# Patient Record
Sex: Female | Born: 1935 | Race: White | Hispanic: No | Marital: Married
Health system: Southern US, Community
[De-identification: ages and names within clinical notes are randomized; demographics above are authoritative.]

---

## 2009-08-26 ENCOUNTER — Inpatient Hospital Stay: Payer: Self-pay | Admitting: Internal Medicine

## 2009-12-03 ENCOUNTER — Inpatient Hospital Stay: Payer: Self-pay | Admitting: Podiatry

## 2010-09-08 ENCOUNTER — Ambulatory Visit: Payer: Self-pay | Admitting: Internal Medicine

## 2011-10-27 ENCOUNTER — Ambulatory Visit: Payer: Self-pay | Admitting: Internal Medicine

## 2012-10-10 ENCOUNTER — Inpatient Hospital Stay: Payer: Self-pay | Admitting: Internal Medicine

## 2012-10-10 LAB — CBC WITH DIFFERENTIAL/PLATELET
Basophil #: 0.1 10*3/uL (ref 0.0–0.1)
Basophil %: 1.2 %
HGB: 12 g/dL (ref 12.0–16.0)
Lymphocyte #: 0.9 10*3/uL — ABNORMAL LOW (ref 1.0–3.6)
Lymphocyte %: 9 %
MCHC: 32.5 g/dL (ref 32.0–36.0)
MCV: 95 fL (ref 80–100)
Monocyte %: 6.2 %
Platelet: 251 10*3/uL (ref 150–440)
RBC: 3.86 10*6/uL (ref 3.80–5.20)
RDW: 15.6 % — ABNORMAL HIGH (ref 11.5–14.5)

## 2012-10-10 LAB — URINALYSIS, COMPLETE
Blood: NEGATIVE
Glucose,UR: NEGATIVE mg/dL (ref 0–75)
Ketone: NEGATIVE
Nitrite: NEGATIVE
Protein: NEGATIVE
Specific Gravity: 1.013 (ref 1.003–1.030)
Squamous Epithelial: <1

## 2012-10-10 LAB — COMPREHENSIVE METABOLIC PANEL
Alkaline Phosphatase: 84 U/L (ref 50–136)
Anion Gap: 4 — ABNORMAL LOW (ref 7–16)
BUN: 45 mg/dL — ABNORMAL HIGH (ref 7–18)
Chloride: 107 mmol/L (ref 98–107)
Creatinine: 2.27 mg/dL — ABNORMAL HIGH (ref 0.60–1.30)
EGFR (Non-African Amer.): 19 — ABNORMAL LOW
Glucose: 144 mg/dL — ABNORMAL HIGH (ref 65–99)
Osmolality: 290 (ref 275–301)
SGOT(AST): 26 U/L (ref 15–37)
SGPT (ALT): 19 U/L (ref 12–78)
Sodium: 138 mmol/L (ref 136–145)

## 2012-10-10 LAB — TROPONIN I: Troponin-I: 0.06 ng/mL — ABNORMAL HIGH

## 2012-10-10 LAB — CK TOTAL AND CKMB (NOT AT ARMC)
CK, Total: 54 U/L (ref 21–215)
CK-MB: 2.7 ng/mL (ref 0.5–3.6)

## 2012-10-10 LAB — PRO B NATRIURETIC PEPTIDE: B-Type Natriuretic Peptide: 13150 pg/mL — ABNORMAL HIGH (ref 0–450)

## 2012-10-11 LAB — CK TOTAL AND CKMB (NOT AT ARMC)
CK, Total: 47 U/L (ref 21–215)
CK-MB: 2.1 ng/mL (ref 0.5–3.6)

## 2012-10-11 LAB — BASIC METABOLIC PANEL
Calcium, Total: 9.2 mg/dL (ref 8.5–10.1)
EGFR (African American): 23 — ABNORMAL LOW
Osmolality: 295 (ref 275–301)
Potassium: 4.1 mmol/L (ref 3.5–5.1)

## 2012-10-11 LAB — TROPONIN I: Troponin-I: 0.08 ng/mL — ABNORMAL HIGH

## 2012-10-12 LAB — CBC WITH DIFFERENTIAL/PLATELET
Basophil #: 0 10*3/uL (ref 0.0–0.1)
HCT: 35.6 % (ref 35.0–47.0)
Lymphocyte #: 0.5 10*3/uL — ABNORMAL LOW (ref 1.0–3.6)
MCH: 30.9 pg (ref 26.0–34.0)
MCHC: 32.7 g/dL (ref 32.0–36.0)
MCV: 95 fL (ref 80–100)
Monocyte #: 0.5 x10 3/mm (ref 0.2–0.9)
Neutrophil #: 7.7 10*3/uL — ABNORMAL HIGH (ref 1.4–6.5)
Neutrophil %: 86.1 %
Platelet: 223 10*3/uL (ref 150–440)
RBC: 3.76 10*6/uL — ABNORMAL LOW (ref 3.80–5.20)
RDW: 15.6 % — ABNORMAL HIGH (ref 11.5–14.5)

## 2012-10-12 LAB — BASIC METABOLIC PANEL
Anion Gap: 7 (ref 7–16)
BUN: 42 mg/dL — ABNORMAL HIGH (ref 7–18)
Chloride: 107 mmol/L (ref 98–107)
Co2: 29 mmol/L (ref 21–32)
EGFR (African American): 26 — ABNORMAL LOW
Glucose: 57 mg/dL — ABNORMAL LOW (ref 65–99)
Osmolality: 293 (ref 275–301)
Potassium: 4 mmol/L (ref 3.5–5.1)
Sodium: 143 mmol/L (ref 136–145)

## 2012-10-13 LAB — CBC WITH DIFFERENTIAL/PLATELET
Basophil %: 0.5 %
Eosinophil %: 3.2 %
HCT: 35.5 % (ref 35.0–47.0)
HGB: 11.5 g/dL — ABNORMAL LOW (ref 12.0–16.0)
Lymphocyte #: 1 10*3/uL (ref 1.0–3.6)
MCHC: 32.5 g/dL (ref 32.0–36.0)
MCV: 94 fL (ref 80–100)
Monocyte #: 0.7 x10 3/mm (ref 0.2–0.9)
Monocyte %: 8.7 %
Neutrophil %: 74.2 %
Platelet: 210 10*3/uL (ref 150–440)
RBC: 3.76 10*6/uL — ABNORMAL LOW (ref 3.80–5.20)
RDW: 15.4 % — ABNORMAL HIGH (ref 11.5–14.5)
WBC: 7.7 10*3/uL (ref 3.6–11.0)

## 2012-10-13 LAB — BASIC METABOLIC PANEL
Anion Gap: 5 — ABNORMAL LOW (ref 7–16)
Calcium, Total: 9.1 mg/dL (ref 8.5–10.1)
Chloride: 106 mmol/L (ref 98–107)
Co2: 32 mmol/L (ref 21–32)
Creatinine: 2.02 mg/dL — ABNORMAL HIGH (ref 0.60–1.30)
EGFR (Non-African Amer.): 22 — ABNORMAL LOW
Glucose: 82 mg/dL (ref 65–99)
Potassium: 3.9 mmol/L (ref 3.5–5.1)
Sodium: 143 mmol/L (ref 136–145)

## 2012-10-14 LAB — BASIC METABOLIC PANEL
Anion Gap: 8 (ref 7–16)
BUN: 40 mg/dL — ABNORMAL HIGH (ref 7–18)
Calcium, Total: 8.5 mg/dL (ref 8.5–10.1)
Co2: 29 mmol/L (ref 21–32)
Creatinine: 1.9 mg/dL — ABNORMAL HIGH (ref 0.60–1.30)
EGFR (Non-African Amer.): 23 — ABNORMAL LOW
Glucose: 180 mg/dL — ABNORMAL HIGH (ref 65–99)
Osmolality: 290 (ref 275–301)

## 2012-10-14 LAB — CBC WITH DIFFERENTIAL/PLATELET
Basophil #: 0 10*3/uL (ref 0.0–0.1)
HCT: 36.3 % (ref 35.0–47.0)
HGB: 11.9 g/dL — ABNORMAL LOW (ref 12.0–16.0)
Lymphocyte %: 8.9 %
MCH: 31 pg (ref 26.0–34.0)
MCHC: 32.7 g/dL (ref 32.0–36.0)
Monocyte %: 8.1 %
Neutrophil #: 6.9 10*3/uL — ABNORMAL HIGH (ref 1.4–6.5)
Platelet: 190 10*3/uL (ref 150–440)
RBC: 3.82 10*6/uL (ref 3.80–5.20)
WBC: 8.6 10*3/uL (ref 3.6–11.0)

## 2012-10-17 DIAGNOSIS — I10 Essential (primary) hypertension: Secondary | ICD-10-CM

## 2012-10-17 DIAGNOSIS — E039 Hypothyroidism, unspecified: Secondary | ICD-10-CM

## 2012-10-17 DIAGNOSIS — E1129 Type 2 diabetes mellitus with other diabetic kidney complication: Secondary | ICD-10-CM

## 2012-10-17 DIAGNOSIS — F329 Major depressive disorder, single episode, unspecified: Secondary | ICD-10-CM

## 2012-10-17 DIAGNOSIS — Z9981 Dependence on supplemental oxygen: Secondary | ICD-10-CM

## 2012-10-17 DIAGNOSIS — E785 Hyperlipidemia, unspecified: Secondary | ICD-10-CM

## 2012-10-17 DIAGNOSIS — K219 Gastro-esophageal reflux disease without esophagitis: Secondary | ICD-10-CM

## 2012-10-21 DIAGNOSIS — I509 Heart failure, unspecified: Secondary | ICD-10-CM

## 2012-10-25 DIAGNOSIS — L259 Unspecified contact dermatitis, unspecified cause: Secondary | ICD-10-CM

## 2012-11-11 ENCOUNTER — Emergency Department: Payer: Self-pay | Admitting: Emergency Medicine

## 2012-11-11 LAB — BASIC METABOLIC PANEL
BUN: 50 mg/dL — ABNORMAL HIGH (ref 7–18)
Calcium, Total: 9.5 mg/dL (ref 8.5–10.1)
Co2: 24 mmol/L (ref 21–32)
Creatinine: 1.58 mg/dL — ABNORMAL HIGH (ref 0.60–1.30)
EGFR (Non-African Amer.): 29 — ABNORMAL LOW
Osmolality: 295 (ref 275–301)
Potassium: 4.2 mmol/L (ref 3.5–5.1)
Sodium: 140 mmol/L (ref 136–145)

## 2012-11-11 LAB — CBC
MCH: 31 pg (ref 26.0–34.0)
Platelet: 152 10*3/uL (ref 150–440)
RBC: 3.93 10*6/uL (ref 3.80–5.20)
RDW: 16.4 % — ABNORMAL HIGH (ref 11.5–14.5)

## 2012-12-05 ENCOUNTER — Other Ambulatory Visit: Payer: Self-pay | Admitting: Family Medicine

## 2012-12-18 ENCOUNTER — Other Ambulatory Visit: Payer: Self-pay | Admitting: Family Medicine

## 2013-01-09 ENCOUNTER — Ambulatory Visit: Payer: Self-pay | Admitting: Family Medicine

## 2013-01-27 ENCOUNTER — Inpatient Hospital Stay: Payer: Self-pay | Admitting: Internal Medicine

## 2013-01-27 LAB — COMPREHENSIVE METABOLIC PANEL
Anion Gap: 5 — ABNORMAL LOW (ref 7–16)
BUN: 35 mg/dL — ABNORMAL HIGH (ref 7–18)
Bilirubin,Total: 0.8 mg/dL (ref 0.2–1.0)
Calcium, Total: 9.4 mg/dL (ref 8.5–10.1)
Co2: 28 mmol/L (ref 21–32)
Creatinine: 1.51 mg/dL — ABNORMAL HIGH (ref 0.60–1.30)
EGFR (Non-African Amer.): 31 — ABNORMAL LOW
Glucose: 144 mg/dL — ABNORMAL HIGH (ref 65–99)
Osmolality: 277 (ref 275–301)

## 2013-01-27 LAB — CBC
HGB: 12.3 g/dL (ref 12.0–16.0)
MCH: 31.6 pg (ref 26.0–34.0)
MCV: 93 fL (ref 80–100)
Platelet: 201 10*3/uL (ref 150–440)
RBC: 3.9 10*6/uL (ref 3.80–5.20)
WBC: 8.6 10*3/uL (ref 3.6–11.0)

## 2013-01-27 LAB — APTT: Activated PTT: 30.9 secs (ref 23.6–35.9)

## 2013-01-27 LAB — PRO B NATRIURETIC PEPTIDE: B-Type Natriuretic Peptide: 11605 pg/mL — ABNORMAL HIGH (ref 0–450)

## 2013-01-27 LAB — CK TOTAL AND CKMB (NOT AT ARMC)
CK, Total: 35 U/L (ref 21–215)
CK-MB: 1.6 ng/mL (ref 0.5–3.6)

## 2013-01-27 LAB — TROPONIN I: Troponin-I: 0.04 ng/mL

## 2013-01-28 LAB — BASIC METABOLIC PANEL
Anion Gap: 6 — ABNORMAL LOW (ref 7–16)
Co2: 28 mmol/L (ref 21–32)
Creatinine: 1.48 mg/dL — ABNORMAL HIGH (ref 0.60–1.30)
Glucose: 101 mg/dL — ABNORMAL HIGH (ref 65–99)
Osmolality: 281 (ref 275–301)
Potassium: 4.6 mmol/L (ref 3.5–5.1)
Sodium: 136 mmol/L (ref 136–145)

## 2013-01-28 LAB — TSH: Thyroid Stimulating Horm: 13.9 u[IU]/mL — ABNORMAL HIGH

## 2013-01-28 LAB — HEMOGLOBIN A1C: Hemoglobin A1C: 7.4 % — ABNORMAL HIGH (ref 4.2–6.3)

## 2013-01-29 LAB — CBC WITH DIFFERENTIAL/PLATELET
Basophil #: 0.1 10*3/uL (ref 0.0–0.1)
Eosinophil %: 2.1 %
HCT: 31.6 % — ABNORMAL LOW (ref 35.0–47.0)
Lymphocyte %: 14.4 %
MCHC: 33.7 g/dL (ref 32.0–36.0)
MCV: 92 fL (ref 80–100)
Monocyte #: 0.8 x10 3/mm (ref 0.2–0.9)
Monocyte %: 12.6 %
Neutrophil #: 4.4 10*3/uL (ref 1.4–6.5)
Neutrophil %: 70 %
Platelet: 175 10*3/uL (ref 150–440)
RDW: 15.6 % — ABNORMAL HIGH (ref 11.5–14.5)

## 2013-01-29 LAB — BASIC METABOLIC PANEL
Anion Gap: 7 (ref 7–16)
BUN: 42 mg/dL — ABNORMAL HIGH (ref 7–18)
Calcium, Total: 9 mg/dL (ref 8.5–10.1)
Chloride: 100 mmol/L (ref 98–107)
Co2: 28 mmol/L (ref 21–32)
EGFR (African American): 31 — ABNORMAL LOW
EGFR (Non-African Amer.): 27 — ABNORMAL LOW
Glucose: 85 mg/dL (ref 65–99)
Potassium: 3.9 mmol/L (ref 3.5–5.1)
Sodium: 135 mmol/L — ABNORMAL LOW (ref 136–145)

## 2013-01-30 LAB — BASIC METABOLIC PANEL
Anion Gap: 6 — ABNORMAL LOW (ref 7–16)
BUN: 44 mg/dL — ABNORMAL HIGH (ref 7–18)
Calcium, Total: 8.7 mg/dL (ref 8.5–10.1)
Chloride: 98 mmol/L (ref 98–107)
Co2: 28 mmol/L (ref 21–32)
Creatinine: 1.77 mg/dL — ABNORMAL HIGH (ref 0.60–1.30)
EGFR (African American): 30 — ABNORMAL LOW
Glucose: 87 mg/dL (ref 65–99)
Osmolality: 275 (ref 275–301)
Sodium: 132 mmol/L — ABNORMAL LOW (ref 136–145)

## 2013-01-31 LAB — BASIC METABOLIC PANEL
Anion Gap: 7 (ref 7–16)
BUN: 46 mg/dL — ABNORMAL HIGH (ref 7–18)
Calcium, Total: 8.5 mg/dL (ref 8.5–10.1)
Chloride: 96 mmol/L — ABNORMAL LOW (ref 98–107)
Co2: 28 mmol/L (ref 21–32)
Creatinine: 2.05 mg/dL — ABNORMAL HIGH (ref 0.60–1.30)
EGFR (African American): 25 — ABNORMAL LOW
EGFR (Non-African Amer.): 21 — ABNORMAL LOW
Glucose: 61 mg/dL — ABNORMAL LOW (ref 65–99)
Osmolality: 272 (ref 275–301)
Potassium: 4.3 mmol/L (ref 3.5–5.1)
Sodium: 131 mmol/L — ABNORMAL LOW (ref 136–145)

## 2013-01-31 LAB — CBC WITH DIFFERENTIAL/PLATELET
Basophil #: 0 10*3/uL (ref 0.0–0.1)
Basophil %: 0.7 %
Eosinophil #: 0.2 10*3/uL (ref 0.0–0.7)
Eosinophil %: 3.8 %
HCT: 31.7 % — ABNORMAL LOW (ref 35.0–47.0)
Lymphocyte %: 15.8 %
MCHC: 34.5 g/dL (ref 32.0–36.0)
Neutrophil %: 67.4 %
RBC: 3.43 10*6/uL — ABNORMAL LOW (ref 3.80–5.20)
WBC: 5.3 10*3/uL (ref 3.6–11.0)

## 2013-02-01 LAB — CBC WITH DIFFERENTIAL/PLATELET
Basophil #: 0 10*3/uL (ref 0.0–0.1)
Basophil %: 0.7 %
Eosinophil #: 0.2 10*3/uL (ref 0.0–0.7)
Eosinophil %: 3 %
HGB: 11.1 g/dL — ABNORMAL LOW (ref 12.0–16.0)
Lymphocyte #: 0.8 10*3/uL — ABNORMAL LOW (ref 1.0–3.6)
Lymphocyte %: 11.5 %
MCH: 30.8 pg (ref 26.0–34.0)
MCV: 93 fL (ref 80–100)
Monocyte %: 9.3 %
Neutrophil #: 5 10*3/uL (ref 1.4–6.5)
Neutrophil %: 75.5 %
Platelet: 178 10*3/uL (ref 150–440)
RBC: 3.58 10*6/uL — ABNORMAL LOW (ref 3.80–5.20)

## 2013-02-01 LAB — BASIC METABOLIC PANEL
BUN: 52 mg/dL — ABNORMAL HIGH (ref 7–18)
Chloride: 89 mmol/L — ABNORMAL LOW (ref 98–107)
Creatinine: 2.25 mg/dL — ABNORMAL HIGH (ref 0.60–1.30)
EGFR (African American): 22 — ABNORMAL LOW
EGFR (Non-African Amer.): 19 — ABNORMAL LOW
Glucose: 66 mg/dL (ref 65–99)
Osmolality: 262 (ref 275–301)
Potassium: 4.8 mmol/L (ref 3.5–5.1)
Sodium: 124 mmol/L — ABNORMAL LOW (ref 136–145)

## 2013-02-02 DIAGNOSIS — I10 Essential (primary) hypertension: Secondary | ICD-10-CM

## 2013-02-02 DIAGNOSIS — I5023 Acute on chronic systolic (congestive) heart failure: Secondary | ICD-10-CM

## 2013-02-02 DIAGNOSIS — E1149 Type 2 diabetes mellitus with other diabetic neurological complication: Secondary | ICD-10-CM

## 2013-02-02 DIAGNOSIS — N184 Chronic kidney disease, stage 4 (severe): Secondary | ICD-10-CM

## 2013-02-03 ENCOUNTER — Inpatient Hospital Stay: Payer: Self-pay | Admitting: Internal Medicine

## 2013-02-03 DIAGNOSIS — F411 Generalized anxiety disorder: Secondary | ICD-10-CM

## 2013-02-03 LAB — COMPREHENSIVE METABOLIC PANEL
Anion Gap: 7 (ref 7–16)
Bilirubin,Total: 0.6 mg/dL (ref 0.2–1.0)
Calcium, Total: 8.9 mg/dL (ref 8.5–10.1)
Chloride: 89 mmol/L — ABNORMAL LOW (ref 98–107)
Creatinine: 1.97 mg/dL — ABNORMAL HIGH (ref 0.60–1.30)
EGFR (African American): 26 — ABNORMAL LOW
EGFR (Non-African Amer.): 22 — ABNORMAL LOW
Glucose: 169 mg/dL — ABNORMAL HIGH (ref 65–99)
Osmolality: 269 (ref 275–301)
SGPT (ALT): 19 U/L (ref 12–78)
Sodium: 124 mmol/L — ABNORMAL LOW (ref 136–145)

## 2013-02-03 LAB — TROPONIN I: Troponin-I: 0.03 ng/mL

## 2013-02-03 LAB — URINALYSIS, COMPLETE
Bilirubin,UR: NEGATIVE
Glucose,UR: NEGATIVE mg/dL (ref 0–75)
Ketone: NEGATIVE
Nitrite: POSITIVE
Protein: NEGATIVE
RBC,UR: <1 /HPF (ref 0–5)
Specific Gravity: 1.011 (ref 1.003–1.030)
Squamous Epithelial: NONE SEEN

## 2013-02-03 LAB — CBC: MCH: 31.6 pg (ref 26.0–34.0)

## 2013-02-03 LAB — PRO B NATRIURETIC PEPTIDE: B-Type Natriuretic Peptide: 23513 pg/mL — ABNORMAL HIGH (ref 0–450)

## 2013-02-04 LAB — BASIC METABOLIC PANEL
Anion Gap: 6 — ABNORMAL LOW (ref 7–16)
Calcium, Total: 9 mg/dL (ref 8.5–10.1)
Chloride: 90 mmol/L — ABNORMAL LOW (ref 98–107)
Creatinine: 1.91 mg/dL — ABNORMAL HIGH (ref 0.60–1.30)
EGFR (African American): 27 — ABNORMAL LOW
Osmolality: 275 (ref 275–301)
Potassium: 4.4 mmol/L (ref 3.5–5.1)

## 2013-02-05 LAB — BASIC METABOLIC PANEL
Anion Gap: 7 (ref 7–16)
BUN: 61 mg/dL — ABNORMAL HIGH (ref 7–18)
Calcium, Total: 8.9 mg/dL (ref 8.5–10.1)
Chloride: 89 mmol/L — ABNORMAL LOW (ref 98–107)
Creatinine: 1.85 mg/dL — ABNORMAL HIGH (ref 0.60–1.30)
EGFR (Non-African Amer.): 24 — ABNORMAL LOW
Glucose: 118 mg/dL — ABNORMAL HIGH (ref 65–99)
Osmolality: 268 (ref 275–301)
Potassium: 4.3 mmol/L (ref 3.5–5.1)
Sodium: 124 mmol/L — ABNORMAL LOW (ref 136–145)

## 2013-02-05 LAB — URINE CULTURE

## 2013-02-06 LAB — BASIC METABOLIC PANEL
Co2: 31 mmol/L (ref 21–32)
EGFR (African American): 33 — ABNORMAL LOW
Glucose: 173 mg/dL — ABNORMAL HIGH (ref 65–99)
Potassium: 4.2 mmol/L (ref 3.5–5.1)
Sodium: 126 mmol/L — ABNORMAL LOW (ref 136–145)

## 2013-02-06 LAB — CBC WITH DIFFERENTIAL/PLATELET
Basophil #: 0.1 10*3/uL (ref 0.0–0.1)
Basophil %: 0.8 %
Lymphocyte #: 0.5 10*3/uL — ABNORMAL LOW (ref 1.0–3.6)
Lymphocyte %: 6.1 %
MCH: 31.5 pg (ref 26.0–34.0)
MCV: 92 fL (ref 80–100)
Monocyte #: 0.7 x10 3/mm (ref 0.2–0.9)
Neutrophil #: 6.3 10*3/uL (ref 1.4–6.5)
Platelet: 183 10*3/uL (ref 150–440)
RDW: 14.9 % — ABNORMAL HIGH (ref 11.5–14.5)
WBC: 7.7 10*3/uL (ref 3.6–11.0)

## 2013-02-07 LAB — CBC WITH DIFFERENTIAL/PLATELET
Basophil #: 0 10*3/uL (ref 0.0–0.1)
Basophil %: 0.5 %
Eosinophil #: 0.1 10*3/uL (ref 0.0–0.7)
HCT: 33.3 % — ABNORMAL LOW (ref 35.0–47.0)
HGB: 11.5 g/dL — ABNORMAL LOW (ref 12.0–16.0)
Lymphocyte #: 0.6 10*3/uL — ABNORMAL LOW (ref 1.0–3.6)
Lymphocyte %: 9.3 %
MCH: 31.6 pg (ref 26.0–34.0)
MCV: 92 fL (ref 80–100)
Monocyte %: 8.1 %
Neutrophil #: 5.1 10*3/uL (ref 1.4–6.5)
RBC: 3.64 10*6/uL — ABNORMAL LOW (ref 3.80–5.20)
RDW: 15.3 % — ABNORMAL HIGH (ref 11.5–14.5)

## 2013-02-07 LAB — BASIC METABOLIC PANEL
Anion Gap: 8 (ref 7–16)
BUN: 50 mg/dL — ABNORMAL HIGH (ref 7–18)
Calcium, Total: 9.6 mg/dL (ref 8.5–10.1)
Chloride: 92 mmol/L — ABNORMAL LOW (ref 98–107)
EGFR (African American): 39 — ABNORMAL LOW
Glucose: 175 mg/dL — ABNORMAL HIGH (ref 65–99)
Osmolality: 275 (ref 275–301)
Potassium: 4 mmol/L (ref 3.5–5.1)
Sodium: 128 mmol/L — ABNORMAL LOW (ref 136–145)

## 2013-02-08 LAB — CBC WITH DIFFERENTIAL/PLATELET
Basophil %: 0.7 %
Eosinophil %: 3.2 %
HCT: 33.7 % — ABNORMAL LOW (ref 35.0–47.0)
Lymphocyte #: 0.8 10*3/uL — ABNORMAL LOW (ref 1.0–3.6)
MCH: 31.3 pg (ref 26.0–34.0)
MCHC: 33.9 g/dL (ref 32.0–36.0)
MCV: 92 fL (ref 80–100)
Monocyte #: 0.6 x10 3/mm (ref 0.2–0.9)
Monocyte %: 9.3 %
RBC: 3.66 10*6/uL — ABNORMAL LOW (ref 3.80–5.20)
WBC: 6.6 10*3/uL (ref 3.6–11.0)

## 2013-02-08 LAB — BASIC METABOLIC PANEL
Chloride: 96 mmol/L — ABNORMAL LOW (ref 98–107)
Co2: 30 mmol/L (ref 21–32)
Creatinine: 1.38 mg/dL — ABNORMAL HIGH (ref 0.60–1.30)
EGFR (Non-African Amer.): 35 — ABNORMAL LOW
Osmolality: 280 (ref 275–301)
Potassium: 3.9 mmol/L (ref 3.5–5.1)
Sodium: 132 mmol/L — ABNORMAL LOW (ref 136–145)

## 2013-02-09 LAB — BASIC METABOLIC PANEL
Anion Gap: 7 (ref 7–16)
Calcium, Total: 9 mg/dL (ref 8.5–10.1)
Chloride: 96 mmol/L — ABNORMAL LOW (ref 98–107)
Co2: 30 mmol/L (ref 21–32)
EGFR (African American): 39 — ABNORMAL LOW
EGFR (Non-African Amer.): 34 — ABNORMAL LOW
Glucose: 172 mg/dL — ABNORMAL HIGH (ref 65–99)
Osmolality: 280 (ref 275–301)
Potassium: 3.8 mmol/L (ref 3.5–5.1)
Sodium: 133 mmol/L — ABNORMAL LOW (ref 136–145)

## 2013-02-09 LAB — CBC WITH DIFFERENTIAL/PLATELET
Eosinophil #: 0.2 10*3/uL (ref 0.0–0.7)
HCT: 33.2 % — ABNORMAL LOW (ref 35.0–47.0)
Lymphocyte #: 0.8 10*3/uL — ABNORMAL LOW (ref 1.0–3.6)
MCH: 31.8 pg (ref 26.0–34.0)
Monocyte #: 0.7 x10 3/mm (ref 0.2–0.9)
Neutrophil #: 5.5 10*3/uL (ref 1.4–6.5)
Neutrophil %: 75.6 %
Platelet: 200 10*3/uL (ref 150–440)
RBC: 3.63 10*6/uL — ABNORMAL LOW (ref 3.80–5.20)
WBC: 7.3 10*3/uL (ref 3.6–11.0)

## 2013-02-10 LAB — BASIC METABOLIC PANEL
Calcium, Total: 9.1 mg/dL (ref 8.5–10.1)
Chloride: 95 mmol/L — ABNORMAL LOW (ref 98–107)
EGFR (Non-African Amer.): 31 — ABNORMAL LOW
Glucose: 174 mg/dL — ABNORMAL HIGH (ref 65–99)
Osmolality: 277 (ref 275–301)

## 2013-02-10 LAB — MAGNESIUM: Magnesium: 1.5 mg/dL — ABNORMAL LOW

## 2013-02-13 DIAGNOSIS — I10 Essential (primary) hypertension: Secondary | ICD-10-CM

## 2013-02-13 DIAGNOSIS — I5022 Chronic systolic (congestive) heart failure: Secondary | ICD-10-CM

## 2013-02-13 DIAGNOSIS — E1149 Type 2 diabetes mellitus with other diabetic neurological complication: Secondary | ICD-10-CM

## 2013-03-06 ENCOUNTER — Other Ambulatory Visit: Payer: Self-pay | Admitting: Family Medicine

## 2013-04-08 ENCOUNTER — Other Ambulatory Visit: Payer: Self-pay | Admitting: Internal Medicine

## 2013-04-11 ENCOUNTER — Other Ambulatory Visit: Payer: Self-pay | Admitting: Internal Medicine

## 2013-05-25 ENCOUNTER — Other Ambulatory Visit: Payer: Self-pay | Admitting: Family Medicine

## 2013-05-25 NOTE — Telephone Encounter (Signed)
Electronic refill request, it looks like pt cancelled his new pt appt with you back in June but not sure if you see pt at nursing home, please advise

## 2013-05-25 NOTE — Telephone Encounter (Signed)
Declined.  This is not my pt.  I saw her only at [facility].  She has not established here- needs to get rx from PCP.

## 2013-05-26 NOTE — Telephone Encounter (Signed)
done

## 2013-07-23 LAB — CBC WITH DIFFERENTIAL/PLATELET
Basophil #: 0 10*3/uL (ref 0.0–0.1)
Basophil %: 0.6 %
Eosinophil #: 0.4 10*3/uL (ref 0.0–0.7)
HGB: 11.2 g/dL — ABNORMAL LOW (ref 12.0–16.0)
Lymphocyte #: 1.1 10*3/uL (ref 1.0–3.6)
MCHC: 32.4 g/dL (ref 32.0–36.0)
MCV: 97 fL (ref 80–100)
Monocyte %: 7.9 %
Neutrophil #: 5.3 10*3/uL (ref 1.4–6.5)
Neutrophil %: 71.9 %
RBC: 3.6 10*6/uL — ABNORMAL LOW (ref 3.80–5.20)

## 2013-07-23 LAB — COMPREHENSIVE METABOLIC PANEL
Alkaline Phosphatase: 151 U/L — ABNORMAL HIGH
BUN: 59 mg/dL — ABNORMAL HIGH (ref 7–18)
Bilirubin,Total: 0.3 mg/dL (ref 0.2–1.0)
Calcium, Total: 9.5 mg/dL (ref 8.5–10.1)
EGFR (African American): 24 — ABNORMAL LOW
EGFR (Non-African Amer.): 21 — ABNORMAL LOW
Glucose: 267 mg/dL — ABNORMAL HIGH (ref 65–99)
Osmolality: 296 (ref 275–301)
Potassium: 4.3 mmol/L (ref 3.5–5.1)
SGOT(AST): 21 U/L (ref 15–37)
Sodium: 135 mmol/L — ABNORMAL LOW (ref 136–145)
Total Protein: 7.9 g/dL (ref 6.4–8.2)

## 2013-07-23 LAB — TROPONIN I: Troponin-I: 0.06 ng/mL — ABNORMAL HIGH

## 2013-07-24 ENCOUNTER — Inpatient Hospital Stay: Payer: Self-pay | Admitting: Internal Medicine

## 2013-07-24 LAB — CK-MB: CK-MB: 12.8 ng/mL — ABNORMAL HIGH (ref 0.5–3.6)

## 2013-07-24 LAB — APTT
Activated PTT: 30.9 secs (ref 23.6–35.9)
Activated PTT: 124.3 secs — ABNORMAL HIGH (ref 23.6–35.9)
Activated PTT: 122.7 secs — ABNORMAL HIGH (ref 23.6–35.9)

## 2013-07-24 LAB — URINALYSIS, COMPLETE
Glucose,UR: NEGATIVE mg/dL (ref 0–75)
Nitrite: NEGATIVE
Ph: 5 (ref 4.5–8.0)
Ph: 5 (ref 4.5–8.0)
Squamous Epithelial: 5

## 2013-07-24 LAB — TROPONIN I
Troponin-I: 12 ng/mL — ABNORMAL HIGH
Troponin-I: 26 ng/mL — ABNORMAL HIGH

## 2013-07-25 LAB — CBC WITH DIFFERENTIAL/PLATELET
Eosinophil %: 0 %
HCT: 30.5 % — ABNORMAL LOW (ref 35.0–47.0)
HGB: 10.1 g/dL — ABNORMAL LOW (ref 12.0–16.0)
Lymphocyte #: 0.8 10*3/uL — ABNORMAL LOW (ref 1.0–3.6)
Lymphocyte %: 4.3 %
MCHC: 33.1 g/dL (ref 32.0–36.0)
MCV: 97 fL (ref 80–100)
Monocyte %: 6.6 %
Neutrophil #: 17.4 10*3/uL — ABNORMAL HIGH (ref 1.4–6.5)
Neutrophil %: 88.8 %
Platelet: 210 10*3/uL (ref 150–440)
RBC: 3.15 10*6/uL — ABNORMAL LOW (ref 3.80–5.20)
WBC: 19.6 10*3/uL — ABNORMAL HIGH (ref 3.6–11.0)

## 2013-07-25 LAB — APTT
Activated PTT: 127.3 secs — ABNORMAL HIGH (ref 23.6–35.9)
Activated PTT: 81 secs — ABNORMAL HIGH (ref 23.6–35.9)

## 2013-07-25 LAB — BASIC METABOLIC PANEL
Chloride: 106 mmol/L (ref 98–107)
Creatinine: 2.34 mg/dL — ABNORMAL HIGH (ref 0.60–1.30)
EGFR (African American): 21 — ABNORMAL LOW
EGFR (Non-African Amer.): 18 — ABNORMAL LOW
Glucose: 250 mg/dL — ABNORMAL HIGH (ref 65–99)
Sodium: 135 mmol/L — ABNORMAL LOW (ref 136–145)

## 2013-07-25 LAB — MAGNESIUM: Magnesium: 1.7 mg/dL — ABNORMAL LOW

## 2013-07-25 LAB — LIPID PANEL: HDL Cholesterol: 41 mg/dL (ref 40–60)

## 2013-07-26 ENCOUNTER — Ambulatory Visit: Payer: Self-pay | Admitting: Internal Medicine

## 2013-07-26 LAB — CBC WITH DIFFERENTIAL/PLATELET
Basophil #: 0.2 10*3/uL — ABNORMAL HIGH (ref 0.0–0.1)
Basophil %: 0.6 %
Eosinophil %: 0 %
Lymphocyte #: 0.5 10*3/uL — ABNORMAL LOW (ref 1.0–3.6)
Lymphocyte %: 1.4 %
MCH: 31.3 pg (ref 26.0–34.0)
MCHC: 32.2 g/dL (ref 32.0–36.0)
Neutrophil #: 31.7 10*3/uL — ABNORMAL HIGH (ref 1.4–6.5)
Neutrophil %: 95.3 %
Platelet: 197 10*3/uL (ref 150–440)
RBC: 3.09 10*6/uL — ABNORMAL LOW (ref 3.80–5.20)
RDW: 17.1 % — ABNORMAL HIGH (ref 11.5–14.5)
WBC: 33.3 10*3/uL — ABNORMAL HIGH (ref 3.6–11.0)

## 2013-07-26 LAB — APTT: Activated PTT: 40.8 secs — ABNORMAL HIGH (ref 23.6–35.9)

## 2013-07-26 LAB — BASIC METABOLIC PANEL
Anion Gap: 11 (ref 7–16)
BUN: 74 mg/dL — ABNORMAL HIGH (ref 7–18)
Calcium, Total: 8.4 mg/dL — ABNORMAL LOW (ref 8.5–10.1)
Chloride: 107 mmol/L (ref 98–107)
Co2: 18 mmol/L — ABNORMAL LOW (ref 21–32)
Creatinine: 2.85 mg/dL — ABNORMAL HIGH (ref 0.60–1.30)
EGFR (African American): 17 — ABNORMAL LOW
EGFR (Non-African Amer.): 14 — ABNORMAL LOW
Osmolality: 299 (ref 275–301)
Sodium: 136 mmol/L (ref 136–145)

## 2013-07-26 LAB — URINE CULTURE

## 2013-07-27 DIAGNOSIS — I241 Dressler's syndrome: Secondary | ICD-10-CM

## 2013-07-27 DIAGNOSIS — E1149 Type 2 diabetes mellitus with other diabetic neurological complication: Secondary | ICD-10-CM

## 2013-07-27 DIAGNOSIS — N184 Chronic kidney disease, stage 4 (severe): Secondary | ICD-10-CM

## 2013-07-27 DIAGNOSIS — I5032 Chronic diastolic (congestive) heart failure: Secondary | ICD-10-CM

## 2013-07-31 ENCOUNTER — Telehealth: Payer: Self-pay | Admitting: Family Medicine

## 2013-07-31 NOTE — Telephone Encounter (Signed)
Confidential Office Message [address] [place], [place] [address] p. [phone] f. [phone] To: Gar Gibbon (After Hours Triage) Fax: [phone] From: [facility] Date/ Time: August 22, 2013 6:30 AM Taken By: Coralie Keens, CSR Caller: Tammy Facility: [facility] Patient: Diana Wyatt, Diana Wyatt DOB: September 05, 1923 Phone: [phone] Reason for Call: Tammy RN/[facility] reporting death. Pt expired 08-22-13 at 0615. Regarding Appointment: Appt Date: Appt Time: Unknown Provider: Reason: Details: Outcome: Confidential

## 2013-07-31 NOTE — Telephone Encounter (Signed)
noted 

## 2013-08-10 ENCOUNTER — Ambulatory Visit: Payer: Self-pay | Admitting: Internal Medicine

## 2013-08-10 DEATH — deceased

## 2014-11-30 NOTE — H&P (Signed)
PATIENT NAME:  Diana Wyatt, Diana Wyatt MR#:  [id number] DATE OF BIRTH:  September 18, 1923  DATE OF ADMISSION:  10/10/2012  PRIMARY CARE PHYSICIAN:  Dr. Marcello Fennel at [facility].   CHIEF COMPLAINT:  Shortness of breath with dry cough.   HISTORY OF PRESENTING ILLNESS:  An 81 year old female patient with history of diabetes, hypertension, CKD stage IV with baseline creatinine around 2 to 2.5 who presents to the Emergency Room with 2 weeks of shortness of breath which is progressively worsening. The patient was found to be having saturations in the 80s and presently is on 3 liters of oxygen. A chest x-ray shows bilateral pulmonary edema with pleural effusions. She has received a dose of IV Lasix 60 mg. Afebrile. Normal white count.   She does not complain of any PND, orthopnea or edema. She saw Dr. Gwen Pounds 1 week prior in his clinic. The patient is scheduled for an echocardiogram later this week as outpatient. She does not complain of any chest pain, nausea, vomiting, dysuria, hematuria, rash or fever. Has a dry cough. No history of congestive heart failure. Does not use any home oxygen and does not smoke.   PAST MEDICAL HISTORY:  Hypertension, diabetes mellitus, CKD stage IV with creatinine baseline between 2 to 2.5.   ALLERGIES:  No known drug allergies.   SOCIAL HISTORY:  The patient lives at home with her husband. Ambulates on her own. No smoking. No alcohol. No illicit drugs.   CODE STATUS:  DNR/DNI as discussed with the patient with husband at bedside.   FAMILY HISTORY:  Reviewed, unknown.   REVIEW OF SYSTEMS:  CONSTITUTIONAL: No fever, complains of fatigue and weakness, no weight loss, weight gain.  EYES: No blurred vision, pain or redness.  ENT: No tinnitus, ear pain, hearing loss.  RESPIRATORY: Has a dry cough, shortness of breath with hypoxia.  CARDIOVASCULAR: No chest pain, orthopnea, edema or arrhythmias.  GASTROINTESTINAL: No nausea, vomiting, diarrhea, abdominal pain.  GENITOURINARY: No  dysuria, hematuria, frequency.  ENDOCRINE: No polyuria or nocturia. Has hypothyroidism.  HEMATOLOGIC/LYMPHATIC: No anemia, easy bruising, bleeding.  INTEGUMENTARY: No acne, rash, lesions.  MUSCULOSKELETAL: No back pain or arthritis.  NEUROLOGIC: No focal numbness, weakness, dysarthria.  PSYCHIATRIC: No anxiety or depression.   HOME MEDICATIONS:   1.  Allopurinol 100 mg once a day. 2.  Aspirin 81 mg once a day.  3.  Bystolic 5 mg oral once a day.  4.  Calcium carbonate 1 tablet oral once a day.  5.  Coenzyme Q 1 capsule oral once a day.  6.  Lantus 30 units in the morning and 20 units at night.  7.  Lasix 40 mg oral 2 times a day.  8.  Lipitor 20 mg oral once a day.  9.  Multivitamin 1 tablet oral once a day.  10.  Neurontin 300 mg oral once a day.  11.  Norvasc 10 mg oral once a day.  12.  Omeprazole 20 mg once a day.  13.  Sertraline 75 mg once a day.  14.  Synthroid 88 mcg oral once a day.   PHYSICAL EXAMINATION: VITAL SIGNS: Temperature 97.8, pulse 65, respirations 22, blood pressure 171/75, saturating 80% on room air and 93% on 3 liters of oxygen.  GENERAL: Elderly Caucasian female patient lying in bed in respiratory distress with conversational dyspnea and coughing.  PSYCHIATRIC: Alert and oriented x 3. Mood and affect appropriate. Judgment intact.  HEENT: Atraumatic, normocephalic. Oral mucosa moist and pink. External ears and nose normal. No pallor.  Pupils bilaterally equal and reactive to light.  NECK: Supple. No thyromegaly. No palpable lymph nodes. No JVD.  CARDIOVASCULAR: S1 and S2 with systolic murmur. Peripheral pulses 2+. No edema.  RESPIRATORY: Bilateral crackles. Decreased air entry at the left base.  GASTROINTESTINAL: Soft abdomen, nontender. Bowel sounds present. No hepatosplenomegaly palpable.  SKIN: Warm and dry. No petechiae, rash, ulcers.  MUSCULOSKELETAL: No joint swelling, redness, effusion of the large joints. Normal muscle tone.  NEUROLOGICAL: Motor  strength 5 out of 5 in upper and lower extremities. Sensation to fine touch intact all over.  LYMPHATIC: No cervical lymphadenopathy.  GENITOURINARY: No CVA tenderness or bladder distention.   DIAGNOSTIC DATA:  Glucose 144. BNP is 13,000. BUN is 45, creatinine 2.27, sodium 138, potassium 4.2 with GFR of 19. AST, ALT, and alkaline phosphatase are normal. CK is 54 with MB 2.7 and troponin 0.06. WBC is 10.3, hemoglobin 12, platelets 251, neutrophils 82%. Urinalysis shows no bacteria. EKG shows left bundle branch block, same as prior EKG. Chest x-ray shows pulmonary edema bilaterally with pleural effusion small.   ASSESSMENT AND PLAN:   1.  Acute respiratory failure secondary to acute new onset congestive heart failure. We will start the patient on intravenous Lasix and also start her on Coreg. We will get an echocardiogram and consult Dr. Gwen PoundsKowalski, her cardiologist. The patient does have mild elevation in troponin of 0.06. No chest pain and normal CK and MB. This is likely from the congestive heart failure. BNP is 13,000. I do not suspect pneumonia at this time as the patient does not have any productive cough, no fever and no elevated white count. Strict intake and output and fluid restriction with cardiac diet.  2.  Uncontrolled hypertension. Add Coreg and continue other home medications.  3.  Chronic kidney disease, stage IV. The patient's creatinine seems to be between 2 to 2.5 at baseline and presently at 2.27. This needs to be monitored as the patient has new onset congestive heart failure and is on intravenous Lasix.  4.  Diabetes mellitus. Continue the Lantus sliding scale insulin and diabetic diet.  5.  Deep vein prophylaxis with heparin.  6.  CODE STATUS. DNR/DNI as discussed with the patient.   TIME SPENT ON THIS CASE:  40 minutes.    ____________________________ Molinda BailiffSrikar R. Sudini, MD srs:si D: 10/10/2012 19:13:00 ET T: 10/10/2012 22:18:19 ET JOB#: [id number]  cc: Wardell HeathSrikar R. Elpidio AnisSudini, MD,  <Dictator> Lamar BlinksBruce J. Kowalski, MD Barbette ReichmannVishwanath Hande, MD  Orie FishermanSRIKAR R SUDINI MD ELECTRONICALLY SIGNED 10/11/2012 14:06

## 2014-11-30 NOTE — Discharge Summary (Signed)
PATIENT NAME:  Diana Wyatt, Diana Wyatt MR#:  [id number] DATE OF BIRTH:  Jul 06, 1924  DATE OF ADMISSION:  02/03/2013 DATE OF DISCHARGE:  02/10/2013  DISCHARGE DIAGNOSIS:  1.  Acute respiratory failure secondary to systolic congestive heart failure.  2.  Urinary tract infection with Escherichia coli.  3.  Hypertension.  4.  Type 2 diabetes.  5.  Chronic kidney disease.  6.  Hypothyroidism.  7.  Weakness.   CHIEF COMPLAINT: Shortness of breath.   HISTORY OF PRESENT ILLNESS: Diana Wyatt is an 81 year old female with a history of hypertension, diabetes, CHF, CKD and hypothyroidism who was recently discharged from [facility] and sent to rehabilitation who returns with worsening shortness of breath. The patient denies any fevers or chills. She was treated with oxygen and IV Lasix. She was also noted to have a UTI.  Urine cultures came back with E. coli and the patient received a total of 5 days of intravenous Rocephin for this.   PHYSICAL EXAMINATION: VITAL SIGNS: Temperature 98.2, blood pressure 157/73, pulse 73, O2 sat 96% on 2 liters nasal cannula.  GENERAL: She was alert, awake and oriented, not in distress.  HEENT:  Pupils equal and reactive to light. Mucous membranes were dry.  HEART: S1 and S2, irregular rhythm.  LUNGS: No wheezing, but bibasilar crackles. No use of accessory muscles of respiration.  ABDOMEN: Soft, nontender.  EXTREMITIES: No edema.  NEUROLOGIC: Nonfocal.   LABORATORY AND DIAGNOSTICS: ABG showed pH of 7.38, pCO2 40 and pO2 55.   Chest x-ray showed atelectasis versus pleural effusion in the left base.   BUN 55, creatinine 1.97, glucose 169, sodium 124, potassium 4.7, chloride 89 and bicarb 28. Troponin 0.03. Magnesium 2.1. BNP 23,530.   EKG shows sinus bradycardia with PVCs at 56 beats per minute and left axis deviation.   HOSPITAL COURSE: The patient was [facility] and treated with IV Lasix. She was also treated with IV Rocephin. During her  stay in the hospital, she gradually improved. Her dose of sertraline was reduced from 75 mg to 50 mg.  Her hyponatremia also improved. Renal function remained stable at 1.5, sodium was 132, and she was transitioned to p.o. Lasix and discharged to rehab on the following medications.   DISCHARGE MEDICATIONS: 1.  Lisinopril 10 mg once a day.  2.  Gabapentin 300 mg at bedtime as needed.  3.  Allopurinol 100 mg once a day. 4.  Aspirin 81 mg a day. 5.  Amlodipine 5 mg once a day. 6.  Prilosec 20 mg once a day. 7.  Lantus insulin 10 units b.i.d. 8.  Levothyroxine 88 mcg a day. 9.  Glucerna shake b.i.d.  10.  Furosemide 60 mg q. 12 hours.  11.  Sertraline 50 mg once a day.   DISCHARGE INSTRUCTIONS:  The patient has been advised to follow-up with me, Dr. Marcello FennelHande, in 1 to 2 weeks' time. Advised low-carb diet.    Total time spent in discharge of patient; 35 minutes ____________________________ Barbette ReichmannVishwanath Fawzi Melman, MD vh:sb D: 02/10/2013 10:16:08 ET T: 02/10/2013 10:32:20 ET JOB#: [id number]  cc: Barbette ReichmannVishwanath Abigal Choung, MD, <Dictator> Barbette ReichmannVISHWANATH Elisah Parmer MD ELECTRONICALLY SIGNED 02/21/2013 17:34

## 2014-11-30 NOTE — H&P (Signed)
PATIENT NAME:  Diana Wyatt, Diana Wyatt MR#:  [id number] DATE OF BIRTH:  April 30, 1924  DATE OF ADMISSION:  02/03/2013  PRIMARY CARE PHYSICIAN:  Dr. Marcello Fennel.  CHIEF COMPLAINT: Shortness of breath for 2 days.   HISTORY OF PRESENT ILLNESS: The patient is an 81 year old Caucasian female with a history of hypertension, diabetes, congestive heart failure, CKD, hypothyroidism, who was sent from nursing home due to shortness of breath for 2 days; and actually the patient was just discharged from hospital two days ago after treatment of congestive heart failure. The patient is alert, awake, oriented, in no acute distress. The patient said on discharge she had some mild shortness of breath, but after physical therapy in subacute rehab, the patient notes shortness of breath has been worsening. Today, she could not breathe, so she was sent to the ED for further evaluation. The patient denies any fever or chills. Has a cough but no sputum. No palpitations, chest pain, orthopnea or nocturnal dyspnea. No leg edema. The patient's was O2 was 50, pO2 was 50 the ED and was treated with oxygen by nasal cannula and Lasix.   PAST MEDICAL HISTORY: Hypertension, diabetes, CKD, congestive heart failure, hypothyroidism.   PAST SURGICAL HISTORY:  None.  SOCIAL HISTORY: No smoking or drinking or illicit drugs. The patient is living in the skilled nursing facility after discharge 2 days ago.   FAMILY HISTORY: The patient does not know.   ALLERGIES: No.   HOME MEDICATIONS:  1. Sertraline 25 mg 3 tablets once a day at bedtime.  2. Prilosec 20 mg p.o. daily.  3.  Nebivolol  5 mg p.o. daily.  4. Lisinopril 10 mg p.o. daily.  5. Levothyroxine 88 mcg p.o. daily. 6. Lantus 10 units subcutaneous twice daily. 7. Gabapentin 300 mg p.o. at bedtime.  8. Lasix 40 mg p.o. b.i.d.  9. Aspirin 81 mg p.o. daily. 10. Norvasc 5 mg p.o. daily.  11. Allopurinol 100 mg p.o. daily.   REVIEW OF SYSTEMS:   CONSTITUTIONAL: The patient denies any  fever or chills. No headache or dizziness, but has weakness.   EYES:  No double vision or blurry vision.   ENT: No postnasal drip, slurred speech or dysphagia.   CARDIOVASCULAR: No chest pain and palpitations. No orthopnea or nocturnal dyspnea. No leg edema.   PULMONARY: Positive for cough, shortness of breath, but no sputum or hemoptysis.   GASTROINTESTINAL: No abdominal pain, nausea, vomiting or diarrhea.   GENITOURINARY: No dysuria, hematuria or incontinence.   SKIN: No rash or jaundice.   NEUROLOGY: No syncope, loss of consciousness or seizure.   ENDOCRINE: No polyuria, polydipsia, heat or cold intolerance.   HEMATOLOGY: No easy bruising or bleeding.   PHYSICAL EXAMINATION:  VITAL SIGNS: Temperature 98.2, blood pressure 157/73, pulse 53, oxygen saturation 96% on oxygen 2 liters by nasal cannula.   GENERAL: The patient is alert, awake, oriented, in no acute distress.   HEENT: Pupils round, equal and reactive to light and accommodation. Dry oral mucosa. Clear oropharynx.   NECK: Supple. No JVD or carotid bruit. No lymphadenopathy, no thyromegaly.   CARDIOVASCULAR: S1, S2 regular rate and rhythm. No murmurs or gallops.   PULMONARY: Bilateral air entry. No wheezing, but has bilateral basilar rales .  No use of accessory muscles to breathe.  ABDOMEN: Soft. No distention. No tenderness. No organomegaly. Bowel sounds present.   EXTREMITIES: No edema, clubbing or cyanosis. No calf tenderness. Strong bilateral pedal pulses.   SKIN: No rash or jaundice.   NEUROLOGIC: A and  O x 3. No focal deficit. Power 5/5. Sensation intact.   LABORATORY DATA: Urinalysis showed WBC is less than 1, bacteria 3+, nitrite positive. ABG showed pH 7.38, pCO2 of 40, pO2 of 55. Chest x-ray; left lung base infiltrate versus atelectasis with pleural effusion.  Glucose 169, BUN 55, creatinine 1.97.  Electrolytes sodium 124, potassium 4.7, chloride 89 and bicarbonate 28. CBC normal. Troponin 0.03,  magnesium 2.1. BNP 23,530.  EKG shows sinus bradycardia with occasional PVCs at 56 BPM and left axis deviation.   IMPRESSION: 1. Acute respiratory failure due to congestive heart failure decompensation. 2. Acute on chronic congestive heart failure, systolic dysfunction with an ejection fraction 30 to 35%. 3. Urinary tract infection.  4. Hypertension.  5. Diabetes.  6. Chronic kidney disease, stage III.  7. Hypothyroidism.   PLAN OF TREATMENT: 1. The patient will be admitted to telemetry floor. We will continue oxygen by nasal cannula. Start Lasix 40 mg IV b.i.d. and start congestive heart failure protocol. Follow up the BMP, magnesium level.  2. For urinary tract infection, we will start Rocephin and follow up urine culture. 3. For diabetes, we will start sliding scale.  4. Continue other home medication. We will get a physical therapy evaluation.   The patient's CODE STATUS is DO NOT RESUSCITATE. I discussed the patient's condition and plan of treatment with the patient and Dr. Marcello FennelHande.   TIME SPENT: About 58 minutes.   ____________________________ Diana PollackQing Linnette Panella, MD qc:rw D: 02/03/2013 14:40:27 ET T: 02/03/2013 15:45:49 ET JOB#: [id number]  cc: Diana PollackQing Avyukt Cimo, MD, <Dictator> Diana PollackQING Bently Morath MD ELECTRONICALLY SIGNED 02/03/2013 18:52

## 2014-11-30 NOTE — Consult Note (Signed)
Brief Consult Note: Diagnosis: chest pain and nstemi.   Patient was seen by consultant.   Recommend further assessment or treatment.   Orders entered.   Comments: Called by ward secretary regarding urgent consult on 81 yo femeale with no prior cardiac history who was admittted iwth chest pain and has ruled in for a nstemi wiht peak tropoinin thus far of 12. She also has renal insuffiency with serum creatinine of 2.07 and eGFR of 21. She is having recurrent chest pain. Given renal funciton and age, invasive evauation needds to be avoided if possible. Will transfer to telemetry and attempt to control with heparin, iv nitraes, beta blockers, and asa. further recs pending course.  Electronic Signatures: Teodoro Spray (MD)  (Signed 15-Dec-14 11:23)  Authored: Brief Consult Note   Last Updated: 15-Dec-14 11:23 by Teodoro Spray (MD)

## 2014-11-30 NOTE — H&P (Signed)
PATIENT NAME:  Diana Wyatt, Diana Wyatt MR#:  [id number] DATE OF BIRTH:  10-08-1923  DATE OF ADMISSION:  01/27/2013  PRIMARY CARE PHYSICIAN: Barbette ReichmannVishwanath Hande, MD  CHIEF COMPLAINT: Shortness of breath since yesterday.   HISTORY OF PRESENT ILLNESS: An 81 year old Caucasian female with a history of hypertension, diabetes, CKD stage IV, with a creatinine baseline between 2.0 to 2.5, who came to the ED due to shortness of breath since yesterday. The patient denies any recent ill contacts. No fever or chills. No headache or dizziness, but the patient only complains of shortness of breath, cough with clear sputum and orthopnea. The patient uses 2 pillows during sleep, but she denies any leg edema. No weight gain. The patient denies any chest pain, palpitations, but chest x-ray in the ED showed interstitial edema. BNP more than 11,000. Actually, the patient was diagnosed with CHF this March has been placed on Lasix treatment.   PAST MEDICAL HISTORY:  1. Hypertension.  2. Diabetes. 3. CKD. 4. CHF.  5. Hypothyroidism.   PAST SURGICAL HISTORY: None.   FAMILY HISTORY: Unknown.   PERSONAL HISTORY:  No smoking, alcohol drinking or illicit drugs. Living with her husband  ALLERGIES: No allergies.  HOME MEDICATIONS:  1. Sertraline 50 mg p.o. 1.5 tablets once a day.  2. Omeprazole 20 mg p.o. daily. 3. Norvasc 10 mg p.o. daily. 4. Neurontin 300 mg p.o. at bedtime.  5. Levothyroxine 88 mcg p.o. daily. 6. Lantus 12 units in the morning and in the evening. 7. Lasix 40 mg p.o. b.i.d. 8. Bystolic 5 mg p.o. daily.  9. Allopurinol 100 mg p.o. once a day.   REVIEW OF SYSTEMS:  CONSTITUTIONAL: The patient denies any fever or chills. No headache or dizziness, but has generalized weakness.  EYES: No double vision or blurred vision.  ENT: No postnasal drip, slurred speech or dysphagia. No epistaxis.  CARDIOVASCULAR: No chest pain, palpitation, but has orthopnea. No nocturnal dyspnea or leg edema.  PULMONARY:  Positive for cough, sputum, shortness of breath. No hemoptysis. No wheezing.  GASTROINTESTINAL: No abdominal pain, nausea, vomiting or diarrhea. No melena or bloody stool.  GENITOURINARY: No dysuria, hematuria or incontinence.  SKIN: No rash or jaundice.  NEUROLOGY: No syncope, loss of consciousness or seizure.  HEMATOLOGY: No easy bruising or bleeding.  ENDOCRINOLOGY: No polyuria, polydipsia, heat or cold intolerance.   PHYSICAL EXAMINATION:  VITAL SIGNS: Temperature 98.3, blood pressure 181/60, O2 saturation 98% on oxygen by nasal cannula now, O2 saturation was 85 when the patient came to the ED.  GENERAL: The patient is alert, awake, oriented, in no acute distress.  HEENT: Pupils round, equal and reactive to light and accommodation. Dry oral mucosa. Clear oropharynx.  NECK: Supple. No JVD or carotid bruits. No lymphadenopathy. No thyromegaly.  CARDIOVASCULAR: S1, S2, regular rate and rhythm. No murmurs or gallops.  PULMONARY: Bilateral air entry. No wheezing, but has bilateral basilar rales, especially on the right side. No use of accessory muscles to breathe.  ABDOMEN: Soft. No distention or tenderness. No organomegaly. Bowel sounds present.  EXTREMITIES: No edema, clubbing or cyanosis. No calf tenderness. Strong bilateral pedal pulses.  SKIN: No rash or jaundice.  NEUROLOGIC: A and O x3. No focal deficit. Power 5 out of 5. Sensation intact.   LABORATORY DATA: CBC normal. PTT is 13.9. BNP 11,605. CK 35, CK-MB 1.6. Glucose 144, BUN 35, creatinine 1.5, sodium 133, potassium 4.5, chloride 100, bicarbonate 28, magnesium 2.0. INR 1.0. Troponin 0.04. Chest x-ray showed decreasing interstitial edema. Continued consolidation in the  left lung base with left pleural effusion present. EKG shows wide QRS rhythm with left axis deviation, left bundle branch block at 68 BPM.   IMPRESSIONS:  1. Acute on chronic congestive heart failure, systolic dysfunction, with ejection fraction 30% to 35%.  2.  Hypertension.  3. Diabetes.  4. Chronic kidney disease stage III.  5. Hypothyroidism.   PLAN OF TREATMENT:  1. The patient will be admitted to telemetry floor. We will continue O2 by nasal cannula. Start Lasix 40 mg IV b.i.d. Continue Bystolic, Norvasc, aspirin. 2. For diabetes, the patient said that the patient is taking Lantus 12 units b.i.d. at home. The patient's blood sugar was about 70 to 140. Will check hemoglobin A1c and may adjust Lantus dose depending on the patient's blood sugar.  3. For CKD, we will monitor the patient's BMP due to Lasix treatment.  4. GI and deep vein thrombosis prophylaxis.  5. Physical therapy evaluation and fall precautions. The patient said that the patient usually uses a walker.   Discussed the patient's condition and plan of treatment with the patient and the patient's husband.   CODE STATUS: The patient's code status is DNR.   TIME SPENT: About 57 minutes.   ____________________________ Shaune Pollack, MD qc:OSi D: 01/27/2013 13:38:10 ET T: 01/27/2013 14:04:19 ET JOB#: [id number]  cc: Shaune Pollack, MD, <Dictator> Shaune Pollack MD ELECTRONICALLY SIGNED 01/28/2013 13:42

## 2014-11-30 NOTE — Discharge Summary (Signed)
PATIENT NAME:  Diana Wyatt, [name] MR#:  [id number] DATE OF BIRTH:  1924-07-17  DATE OF ADMISSION:  07/24/2013 DATE OF DISCHARGE:  07/26/2013   DISCHARGE DIAGNOSES: 1.  Acute myocardial infarction.  2.  Systolic congestive heart failure.  3.  Pneumonia.  4.  Chronic kidney disease.  5.  Hypothyroidism.   CHIEF COMPLAINT: Chest pain.   HISTORY OF PRESENT ILLNESS: Diana Wyatt is an 81 year old female with a history of diabetes, hypertension, chronic kidney disease, congestive heart failure with EF of 25% to 30%, admitted with chest pain. The patient ruled in for an MI with elevated troponin. She was seen in consultation by cardiologist, Dr. Lady GaryFath, who felt that given her advanced age, comorbid condition, and chronic kidney disease and significant reduction in GFR, she would not be a candidate for cardiac catheterization. The patient was subsequently moved to the Critical Care Unit and placed on IV dopamine and heparin because of low blood pressure. She was transferred back to the floor but appeared to be weak. She was also noted to have an elevated white cell count of 32.3 with chest x-ray evidence for pneumonia. The patient was in critical condition, and hope of recovery was very slim. Discussions were held with the family, also consulted Dr. Harriett SineNancy Phifer of palliative care, and it was felt that the patient should be kept comfortable. [facility] was [facility] to [facility] with hosp[facility] and comfort care. She was placed on morphine for pain control and comfort measures, and family was made aware of overall poor prognosis and they were in full agreement with plan of action.   Total time spent in discharge of this pt: 35 minutes  ____________________________ Barbette ReichmannVishwanath Shivansh Hardaway, MD vh:jcm D: 07/26/2013 13:37:55 ET T: 07/26/2013 16:40:57 ET JOB#: [id number]  cc: Barbette ReichmannVishwanath Taris Galindo, MD, <Dictator> Barbette ReichmannVISHWANATH Ruthy Forry MD ELECTRONICALLY SIGNED 08/01/2013 17:20

## 2014-11-30 NOTE — Consult Note (Signed)
PATIENT NAME:  Diana Wyatt, Diana Wyatt MR#:  [id number] DATE OF BIRTH:  12/20/1923  DATE OF CONSULTATION:  10/11/2012  PRIMARY CARE PHYSICIAN:  Dr. Marcello FennelHande  CONSULTING PHYSICIAN:  Lamar BlinksBruce Wyatt. Kowalski, MD  REASON FOR CONSULTATION: Acute congestive heart failure, diabetes, hypertension, with left bundle-branch block.   CHIEF COMPLAINT: "I'm short of breath."   HISTORY OF PRESENT ILLNESSI: This is an 81 year old female who has had new-onset significant lower leg swelling, pulmonary edema, shortness of breath and hypoxia. The patient was seen in the Emergency Room with these issues and placed on oxygen and given intravenous Lasix with that. She has had some improvements but continues to be short of breath.   She has an EKG showing normal sinus rhythm with a left bundle-branch block and no evidence of acute myocardial infarction at this time with a normal troponin. The patient does have hypertension, for which she is on appropriate medication management. Diabetes is fairly well controlled with hemoglobin A1c below 7 at this time.   REVIEW OF SYSTEMS:  The remainder review of systems negative for vision change, ringing in the ears, hearing loss, cough, congestion, heartburn, nausea, vomiting, diarrhea, bloody stools, stomach pain, extremity pain, leg weakness, cramping of the buttocks, known blood clots, headaches, blackouts, dizzy spells, nosebleeds, congestion, trouble swallowing, frequent urination, urination at night, muscle weakness, numbness, anxiety, depression, skin lesions, skin rashes.   PAST MEDICAL HISTORY: 1. Left bundle-branch block.  2. Hypertension.  3. Diabetes.   FAMILY HISTORY: Father died of a myocardial infarction at an early age.   SOCIAL HISTORY: Currently denies alcohol or tobacco use.   ALLERGIES AND MEDICATIONS:  As listed.  PHYSICAL EXAMINATION:  VITAL SIGNS:  Blood pressure is 136/62 bilaterally, heart rate 70 upright, reclining and regular.  GENERAL: She is a well-appearing  female in no acute distress.  HEAD, EYES, EARS, NOSE, THROAT: No icterus, thyromegaly, ulcers, hemorrhages or xanthelasma.  CARDIOVASCULAR: Regular rate and rhythm, with normal S1 and S2. There is a 2 to 3 out of 6 apical murmur, consistent with mitral regurgitation. PMI is diffuse. Carotid upstroke normal, without bruit. Jugular venous pressure is normal.  LUNGS: Lungs have bibasilar crackles and decreased breath sounds in the bases.  ABDOMEN: Soft, nontender, without hepatosplenomegaly or masses. Abdominal aorta is normal size without bruit.  EXTREMITIES: Show 2+ radial, femoral, dorsal pedal pulses, with 1+ lower extremity edema. No cyanosis, clubbing or ulcers.  NEUROLOGIC: She is oriented to time, place and person, with normal mood and affect.   ASSESSMENT: An 81 year old female with new-onset of congestive heart failure, pulmonary edema, hypoxia, with a left bundle-branch block consistent with acute congestive heart failure, diabetes, hypertension, needing further treatment options.   RECOMMENDATIONS: 1.  Continue intravenous Lasix for lower extremity edema, pulmonary edema and shortness of breath.  2.  Oxygen. 3.  Echo for left ventricular systolic dysfunction.  4.  Valvular heart disease.  5.  Beta blocker for heart rate control and cardiomyopathy with congestive heart failure.  6.  Diabetes control, with a goal hemoglobin A1c below 7. 7.  Consider lipid management, if able, with moderate intensive cholesterol daily with  30% reduction in LDL.  8.  Further diagnostic testing and treatment options after above.    ____________________________ Lamar BlinksBruce Wyatt. Kowalski, MD bjk:dm D: 10/11/2012 10:36:00 ET T: 10/11/2012 11:20:23 ET JOB#: [id number]  cc: Lamar BlinksBruce Wyatt. Kowalski, MD, <Dictator> Lamar BlinksBRUCE Wyatt KOWALSKI MD ELECTRONICALLY SIGNED 10/12/2012 8:56

## 2014-11-30 NOTE — Discharge Summary (Signed)
PATIENT NAME:  Diana Wyatt, Diana Wyatt MR#:  [id number] DATE OF BIRTH:  [birth year]  DATE OF ADMISSION:  01/27/2013 DATE OF DISCHARGE:    DISCHARGE DIAGNOSES:  1.  Acute left-sided congestive heart failure.  2.  Hypertension.  3.  Diabetes.  4.  Acute on chronic kidney disease.  5.  Hypothyroidism.  6.  Weakness.  7.  Anxiety and depression.  8.  Type 2 diabetes.   HISTORY OF PRESENT ILLNESS: Diana Wyatt is an 81 year old female with a history of hypertension, type 2 diabetes, CKD, stage IV, who presented the ED with acute onset shortness of breath. The patient was noted to have a BNP of 11,000. Recently been diagnosed with CHF and started on Lasix.   PAST MEDICAL HISTORY: Significant for hypertension, diabetes, CKD, CHF and hypothyroidism.   PHYSICAL EXAMINATION:  VITAL SIGNS: Temperature 98.3, blood pressure 181/ 60, O2 sat 98% on room air, but was 85% when she first came to the ED.  GENERAL: She was alert and awake.  HEENT: Normocephalic, atraumatic.  NECK: Supple. No JVD.  HEART: S1, S2 regular rate and rhythm.  LUNGS: No wheezing, but bilateral basal crackles were heard.  ABDOMEN: Soft, nontender.  EXTREMITIES: No edema.  NEUROLOGIC: Nonfocal.   LABORATORY STUDIES: BNP was 11,605. CK was 35, MB was 1.6. Glucose 144, BUN 35, creatinine 1.5, sodium 133, potassium 4.5, chloride 100, bicarbonate 28, magnesium 2.0, INR of 1. Chest x-ray showed evidence for interstitial edema and possible consolidation in the left lung base. Repeat chest x-ray showed small left pleural effusion and atelectasis with infiltrate in the left lung base, interstitial infiltrate was also noted.   HOSPITAL COURSE: During his stay in the hospital, the patient was treated with intravenous Lasix and also antibiotics that was subsequently discontinued since she did not have any productive cough. She continued to be weak and it was felt that she would benefit from rehab. With the use of diuretics, she did have  electrolyte abnormalities with a drop in serum sodium and elevation of serum creatinine. Her TSH was also elevated at 13.9. She was seen by physical therapy. It was felt that she would benefit from rehab. She was placed on supplemental oxygen and discharged in stable condition on the following medications: Lisinopril 10 mg once a day, gabapentin 300 mg once a day, sertraline 75 mg once a day, insulin glargine 10 units subcutaneous twice a day, allopurinol 100 mg once a day, aspirin 81 mg a day, amlodipine 5 mg once a day, levothyroxine 88 mcg a day, pantoprazole 40 mg once a day,  furosemide 40 mg every 12 hours and oxygen 2 L nasal cannula. Advised to have a CBC, metabolic and TSH checked in 2 to 3 days' time and appropriate adjustment of medications should be made at that time. The patient has been advised to follow with me, Dr. Marcello FennelHande, in 1 to 2 weeks' time.    Total time spent in discharge and co rdination of care: 40 minutes ____________________________ Barbette ReichmannVishwanath Elona Yinger, MD vh:aw D: 02/01/2013 12:32:28 ET T: 02/01/2013 12:45:41 ET JOB#: [id number]  cc: Barbette ReichmannVishwanath Encarnacion Scioneaux, MD, <Dictator> Barbette ReichmannVISHWANATH Najma Bozarth MD ELECTRONICALLY SIGNED 02/21/2013 17:32

## 2014-11-30 NOTE — H&P (Signed)
PATIENT NAME:  Diana Wyatt, Diana Wyatt MR#:  [id number] DATE OF BIRTH:  April 03, 1924  DATE OF ADMISSION:  07/24/2013  PRIMARY PHYSICIAN: Dr. Marcello FennelHande.   REFERRING PHYSICIAN: Dr. Zenda AlpersWebster.   CHIEF COMPLAINT: Chest pain and epigastric pain.   HISTORY OF PRESENT ILLNESS: The patient is an 81 year old Caucasian female presenting to the ER with a chief complaint of chest pain. The patient is reporting that she started having chest pain yesterday evening at around 6:00 p.m. associated with shortness of breath. She felt like she was tight in her chest, with no radiation. Denies any dizziness or nausea. She has a chronic history of congestive heart failure, but denies any heart attacks in the past. The patient was seen by Dr. Gwen PoundsKowalski in the past. As her chest pain was getting worse, the patient called EMS, and she was brought into the ER. She was given four aspirin, baby aspirin and Maalox. Eventually, her pain was completely resolved. As the patient complained of epigastric abdominal pain as well, CAT scan of the abdomen and pelvis was done without contrast in the ER, which has revealed no acute intra-abdominal findings, decubitus ulcer in the upper gluteal cleft, remote L2 compression fracture. Hospitalist team is called to admit the patient. ER physician has started her on heparin bolus followed by heparin drip. During my examination, the patient denies any chest pain and is resting comfortably. No family members at bedside. The patient also reported that she recently fell and hurt her forehead, and she has a bruise on her forehead. No other complaints.   PAST MEDICAL HISTORY: Hypertension, diabetes mellitus, chronic renal insufficiency, congestive heart failure, systolic in nature. A recent echo has revealed a left ventricular ejection fraction of 25% to 30% which was performed in June 2014.   PAST SURGICAL HISTORY: Appendectomy, cholecystectomy.   ALLERGIES: NO KNOWN DRUG ALLERGIES.   PSYCHOSOCIAL HISTORY: Living  in [place]. Denies smoking, alcohol or illicit drug usage.   FAMILY HISTORY: Father deceased with heart attack; had six heart attacks in the past.   HOME MEDICATIONS: Sertraline 50 mg once daily, Prilosec 20 mg once daily, lisinopril 10 mg p.o. once daily, levothyroxine 88 mcg p.o. once daily, Coreg 3.125 mg p.o. b.i.d., insulin Lantus 20 units subcutaneous 2 times a day, gabapentin 300 mg 1 capsule p.o. once daily, atorvastatin 20 mg once daily, amlodipine 10 mg once daily, allopurinol 100 mg p.o. once daily.   REVIEW OF SYSTEMS: CONSTITUTIONAL: Denies any fever or fatigue.  EYES: Denies blurry vision, double vision.  ENT: Denies epistaxis, discharge.  RESPIRATION: Denies cough, chronic obstructive pulmonary disease.  CARDIOVASCULAR: Complaining of chest pain. Denies syncope.  GASTROINTESTINAL: Denies nausea, vomiting, diarrhea.  GENITOURINARY: No dysuria, hematuria.  GYNECOLOGIC AND BREAST: Denies breast mass or vaginal discharge.  ENDOCRINE: Denies polyuria, nocturia. Has history of diabetes mellitus. INTEGUMENTARY: No acne, rash, lesions.  MUSCULOSKELETAL: No joint pain in the neck and back.  NEUROLOGIC: Denies any vertigo, ataxia.  PSYCHIATRIC: No ADD or OCD.   PHYSICAL EXAMINATION: VITAL SIGNS:  Temperature 97.7, pulse 62, respirations 18, blood pressure 140/69, pulse oximetry is 97%.  GENERAL APPEARANCE: Not in acute distress. Moderately built.  HEENT: Normocephalic. The pupils are equally reacting to light and accommodation. No scleral icterus. No conjunctival injection. Mild bruising and contusion noticed on the middle of the forehead following the fall last Friday. No sinus tenderness. No postnasal drip. Moist mucous membranes.  NECK: Supple. No JVD. No thyromegaly. No lymphadenopathy. Range of motion is intact.  LUNGS: Clear to auscultation  bilaterally. No accessory muscle use and no anterior chest wall tenderness on palpation.  CARDIAC: S1, S2 normal. Regular rate and  rhythm. No murmurs. ABDOMEN:  Soft. Bowel sounds are positive in all four quadrants. Nontender, nondistended. No hepatosplenomegaly. No masses felt.  NEUROLOGIC: Awake, alert, oriented x3. Cranial nerves II through XII are intact. Motor and sensory intact. Reflexes are 2+.  EXTREMITIES: No edema. No cyanosis. No clubbing. She has left ankle wound with a clean bandage.  SKIN: Warm to touch. Normal turgor. No rashes. No lesions.  PSYCHIATRIC: Normal mood and affect.   LABORATORY AND IMAGING STUDIES: CT of the abdomen and pelvis without contrast has revealed no acute intra-abdominal pathology, decubitus ulcer in the upper gluteal cleft without abscess or osteomyelitis, remote L2 compression fracture, remote insufficiency fractures of the left bony pelvis. Chest x-ray, portable, has revealed stable cardiomegaly, no active lung disease. LFTs: Albumin 3.2, alkaline phosphatase 151, CK MB 12.8. Initial troponin 0.06, second one 2.86. WBC 7.4, hemoglobin 11.3, hematocrit 34.7, platelets 256. Glucose 267, BUN 59, creatinine 2.09, sodium 135, potassium 4.8, chloride 103, CO2 22. GFR 21, anion gap 10, serum osmolality 296. Calcium 9.5.  A 12-lead EKG has revealed ST depressions in V5 to V6 and T wave inversions in lead I and aVL.   ASSESSMENT AND PLAN: An 81 year old Caucasian female presenting to the ER with a chief complaint of chest pain radiating to the epigastric area, associated with shortness of breath, with positive ST depressions and elevated troponin. Will be admitted with the following assessment and plan.  1.  Non-ST-elevation myocardial infarction. Admit the patient to telemetry. Continue heparin drip. Acute coronary syndrome protocol with oxygen, nitroglycerin, aspirin, beta blocker and statin. Cardiology consult is called and notified to Dr. Darrold Junker.  2.  Systolic congestive heart failure with recent ejection fraction 25% to 30%, not fluid overloaded.  3.  Hypertension. Blood pressure is stable.  We will continue her home medications.  4.  Diabetes mellitus. The patient will be on sliding scale insulin as she is on nothing by mouth.  5.  Chronic renal insufficiency. Monitor renal function closely. We do not know her baseline.  6.  Hypothyroidism. With the patient on nothing by mouth, we will hold off on the Synthroid. Once she is started on p.o. we will resume Synthroid.  7.  We will provide him GI prophylaxis. Deep vein thrombosis prophylaxis is not needed as the patient is on heparin drip.  CODE STATUS: She is DO NOT RESUSCITATE. Her husband is her medical power of attorney.  Transfer the patient to Dr. Marcello Fennel in the morning.   Diagnosis and plan of care were discussed in detail with the patient. She is in agreement with the plan.   TOTAL TIME SPENT ON ADMISSION: 50 minutes.    ____________________________ Ramonita Lab, MD ag:cg D: 07/24/2013 05:10:32 ET T: 07/24/2013 05:59:11 ET JOB#: [id number]  cc: Ramonita Lab, MD, <Dictator> Ramonita Lab MD ELECTRONICALLY SIGNED 08/04/2013 7:21

## 2014-11-30 NOTE — Discharge Summary (Signed)
PATIENT NAME:  Diana Wyatt, Diana Wyatt MR#:  [id number] DATE OF BIRTH:  Jun 16, 1924  DATE OF ADMISSION:  10/10/2012 DATE OF DISCHARGE:    DIAGNOSES AT TIME OF DISCHARGE: 1.  Acute respiratory failure secondary to new onset systolic congestive heart failure.  2.  Hypertension.  3.  Chronic kidney disease.  4.  Type 2 diabetes.  5.  Generalized weakness.  6.  Depression.   CHIEF COMPLAINT:  Shortness of breath, dry cough.   HISTORY OF PRESENT ILLNESS:  This is an 81 year old female with a history of type 2 diabetes, hypertension, stage IV CKD, who presented to the ER complaining of worsening shortness of breath that started approximately two weeks back. The patient was noted to be hypoxemic with oxygen saturations around 80% on 3 liters nasal cannula. The patient also had a chest x-ray done which showed evidence for bilateral pulmonary edema and pleural effusions. She received a dose of intravenous Lasix in the ED.   PAST MEDICAL HISTORY:  Significant for hypertension, type 2 diabetes, diabetes, CKD with a baseline creatinine of 2 to 2.5.   PHYSICAL EXAMINATION:  VITAL SIGNS:  Temperature was 97.8, pulse was 65, respirations 20, blood pressure 171/75, O2 sat 80% on room air and 93% on 3 liters nasal cannula oxygen.  HEENT:  NCAT.   HEART:  S1, S2 with a 2/6 systolic ejection murmur.  LUNGS:  Bilateral basilar crackles with decreased air entry at the left base.  ABDOMEN:  Soft, nontender.  EXTREMITIES:  Trace edema.  NEUROLOGIC:  Nonfocal.   LABORATORY, DIAGNOSTIC AND RADIOLOGICAL DATA:  Glucose 144. BNP was 13,000. BUN was 45, creatinine 2.27, sodium 138, potassium 4.2. GFR of 19. AST and ALT and alkaline phosphatase were normal. CK was 54, MB was 2.7. Troponin 0.06. WBC 10.3, hemoglobin 12, platelets 251. Urinalysis showed no bacteria. EKG showed a left bundle branch block.   HOSPITAL COURSE:  [facility] and was started on intravenous Lasix. During her stay in the hospital,  she continued to be short of breath. She was also seen in consultation by cardiologist, Dr. Gwen PoundsKowalski. She also had a V/Q scan that was negative. An echocardiogram showed an EF of 30 to 35%, moderate inferior myocardial infarction, moderately dilated left atrium, moderately dilated right atrium and moderate thickening of the anterior and posterior mitral valve leaflets with moderate to severe tricuspid regurgitation, moderately elevated pulmonary hypertension and moderate to severe mitral regurgitation. The patient was also started on lisinopril. Her Lasix was subsequently reduced to 40 mg IV daily. Her creatinine did improve to 1.9. Troponin was mildly elevated 0.06, 0.08 and 0.07 respectively; however, she improved to a significant extent. However, she continued to need supplemental oxygen, and it was felt that she would benefit from rehab. She was transferred in stable condition to [facility] on the following medications.   DISCHARGE MEDICATIONS:  Norvasc 10 mg once a day, Neurontin 3 mg at bedtime, Lipitor 20 mg at bedtime, allopurinol 100 mg once a day, sertraline 50 mg 1.5 tablets a day, omeprazole 20 mg 1 capsule a day, lisinopril 2.5 mg a day, insulin glargine 15 units subcutaneous b.i.d., carvedilol 3.125 mg b.i.d., aspirin 81 mg a day, furosemide 40 mg b.i.d. and oxygen 3 liters nasal cannula.  FOLLOW UP:  The patient has been advised to follow with me, Dr. Marcello FennelHande, in 1 to 2 weeks' time.   Total time spent in discharge and co ordiantion of care: 35 minutes  ____________________________ Barbette ReichmannVishwanath Darinda [place], MD  vh:jm D: 10/14/2012 11:13:27 ET T: 10/14/2012 12:50:33 ET JOB#: [id number]  cc: Barbette Reichmann, MD, <Dictator> Barbette Reichmann MD ELECTRONICALLY SIGNED 11/04/2012 13:15

## 2015-07-06 IMAGING — CT CT ABD-PELV W/O CM
2 of 4 series · 17 of 46 positions shown, 19 images · non-contrast
Comparison: 08/29/2009

CLINICAL DATA: Upper abdominal pain.

EXAM:
CT ABDOMEN AND PELVIS WITHOUT CONTRAST
TECHNIQUE: Multidetector CT imaging of the abdomen and pelvis was performed
following the standard protocol without intravenous contrast.

[Series 2: routine abd pel without · axial · non-contrast · 0.69mm/px · z∈[-938,-583]mm · 14 of 79 slices shown, 16 images]
[im 4/79  soft-tissue]
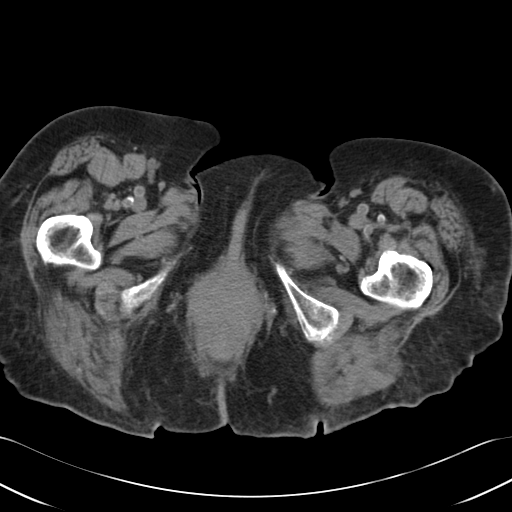
[im 4/79  bone]
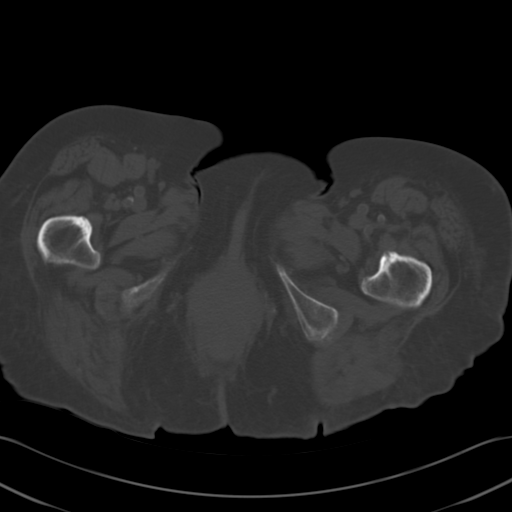
[im 10/79  soft-tissue]
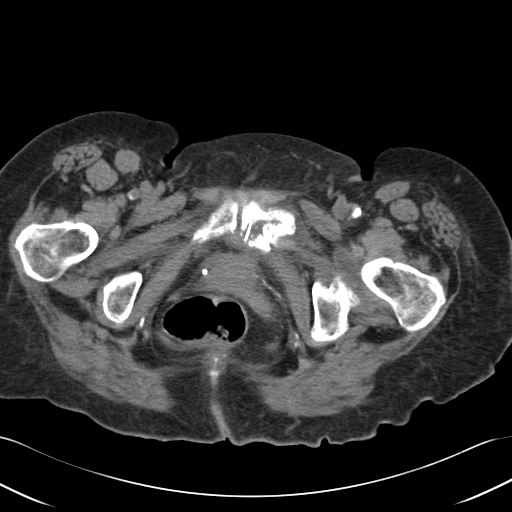
[im 17/79  soft-tissue]
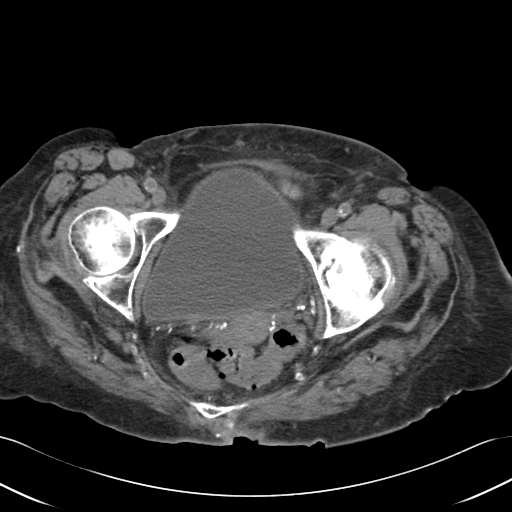
[im 20/79  soft-tissue]
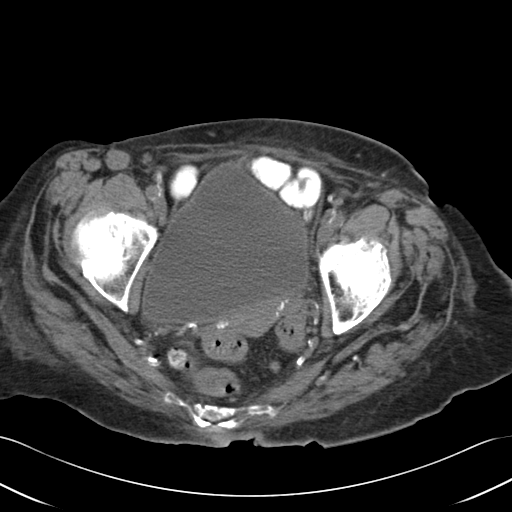
[im 27/79  soft-tissue]
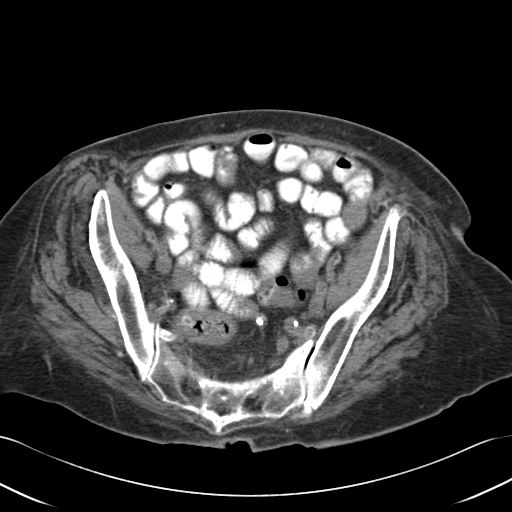
[im 33/79  soft-tissue]
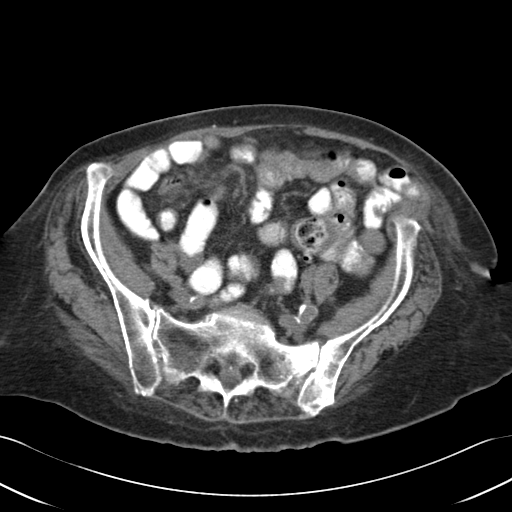
[im 36/79  soft-tissue]
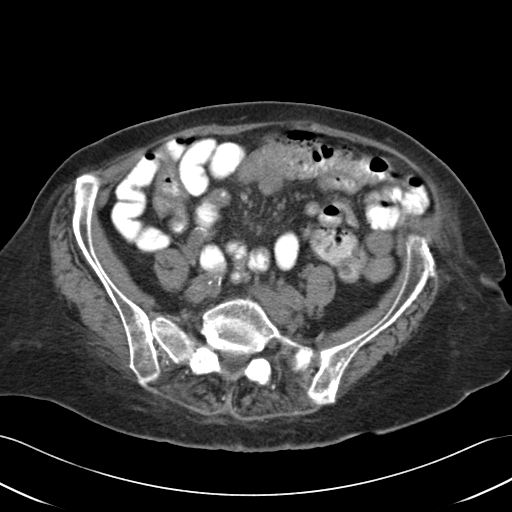
[im 43/79  soft-tissue]
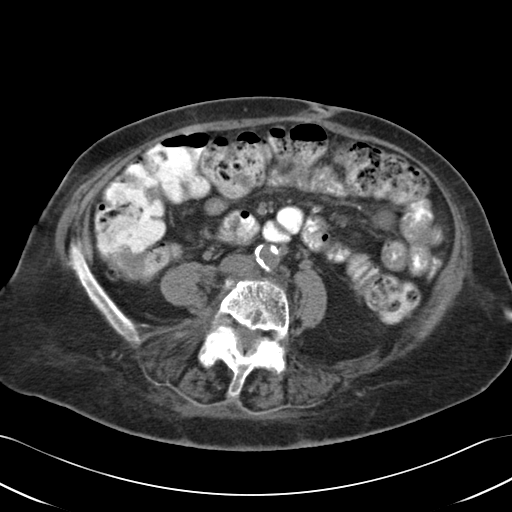
[im 46/79  soft-tissue]
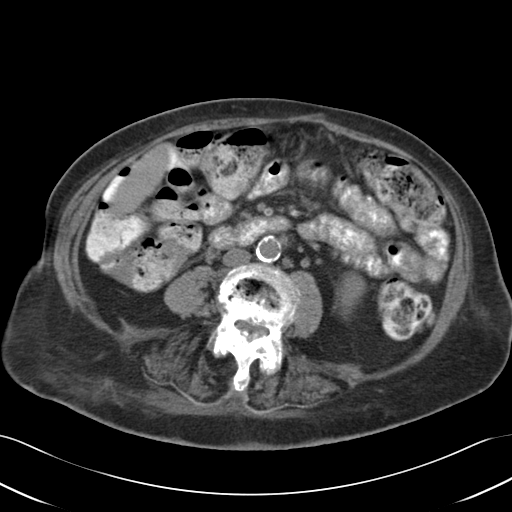
[im 46/79  bone]
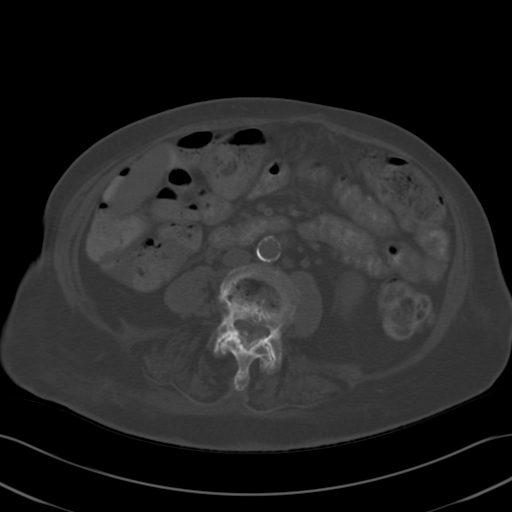
[im 53/79  soft-tissue]
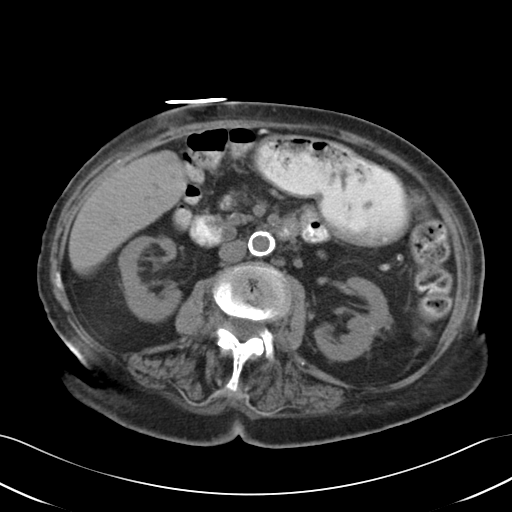
[im 59/79  soft-tissue]
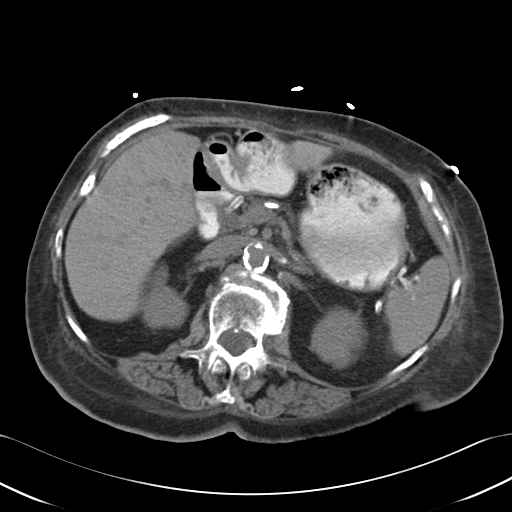
[im 62/79  soft-tissue]
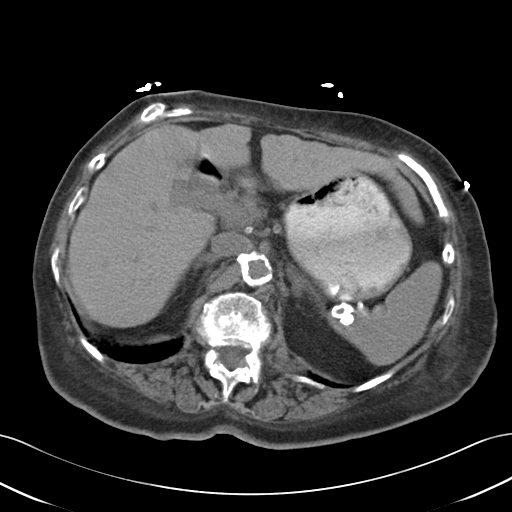
[im 69/79  soft-tissue]
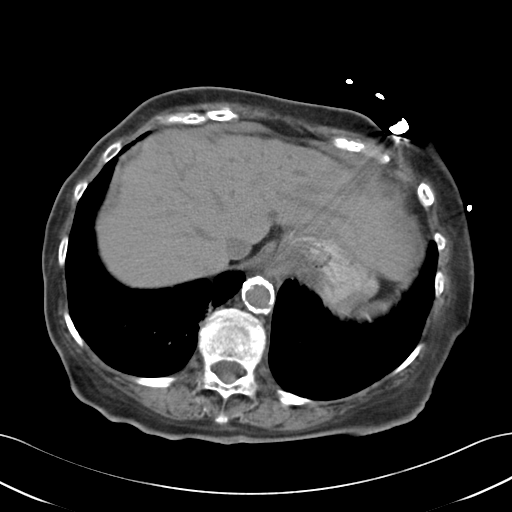
[im 75/79  soft-tissue]
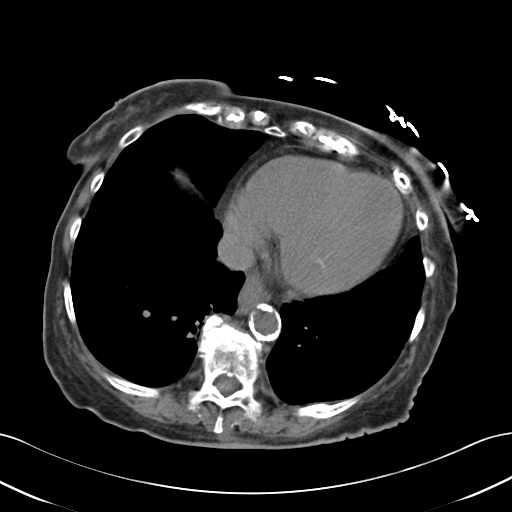

[Series 5: cor routine abd pel wo · coronal · 0.77mm/px · 3 of 121 slices shown]
[im 41/121  soft-tissue]
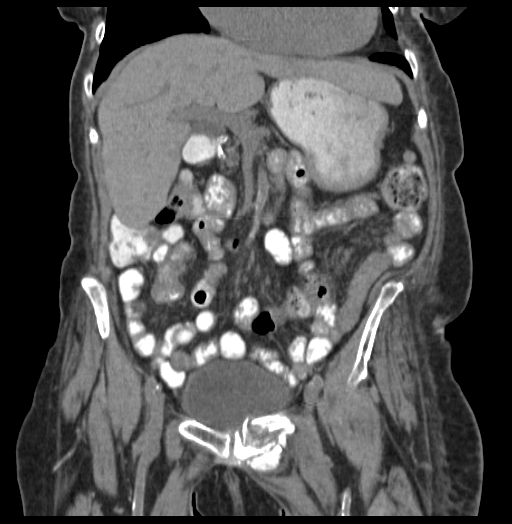
[im 54/121  soft-tissue]
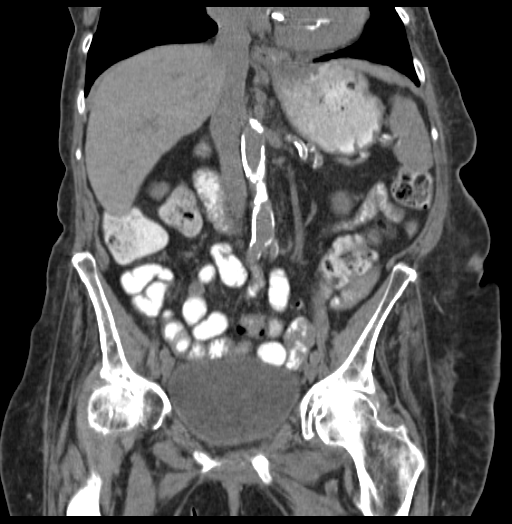
[im 67/121  soft-tissue]
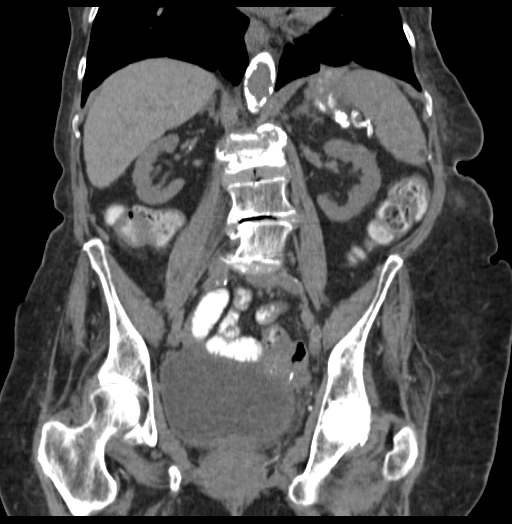

[17 of 46 positions shown; findings below may reference images not displayed]

FINDINGS: BODY WALL: Decubitus ulcer in the upper gluteal cleft. No acute
osseous changes to the underlying sacrum. No abscess.

LOWER CHEST: Mitral annular calcification. Small sliding-type hiatal
hernia with retained oral contrast.

ABDOMEN/PELVIS:

Liver: No focal abnormality.

Biliary: Cholecystectomy, which likely accounts for chronically
distended common bile duct.

Pancreas: Marked atrophy.

Spleen: Benign calcifications in the posterior medial spleen, likely
post infectious or traumatic. Stable, incidental, 1 cm low-density
lesion in the lower spleen.

Adrenals: Unremarkable.

Kidneys and ureters: No hydronephrosis or stone.

Bladder: Distended with urine.

Reproductive: Unremarkable.

Bowel: No obstruction. No pericecal inflammation.

Retroperitoneum: No mass or adenopathy.

Peritoneum: No free fluid or gas.

Vascular: Extensive aortic and branch vessel atherosclerosis.

OSSEOUS: Sclerosis in left sacral ala from previous insufficiency
fracture. Similar appearance in the left posterior ilium. Remote
left pubic body and superior pubic ramus fracture with hyper trophic
changes. Bilateral hip osteoarthritis. Remote appearing L2
compression fracture with bony retropulsion narrowing the spinal
canal. Diffuse, advanced degenerative disc disease.
IMPRESSION: 1. No acute intra-abdominal findings.
2. Decubitus ulcer in the upper gluteal cleft, without abscess or
osteomyelitis.
3. Remote L2 compression fracture with retropulsion causing advanced
canal stenosis.
4. Remote insufficiency fractures of the left bony pelvis.

## 2015-07-09 IMAGING — CR DG CHEST 1V PORT
1 series · 1 of 1 positions shown · non-contrast
Comparison: Prior chest x-ray 07/23/2013

CLINICAL DATA: Leukocytosis, short of breath

EXAM:
PORTABLE CHEST - 1 VIEW

[ap]
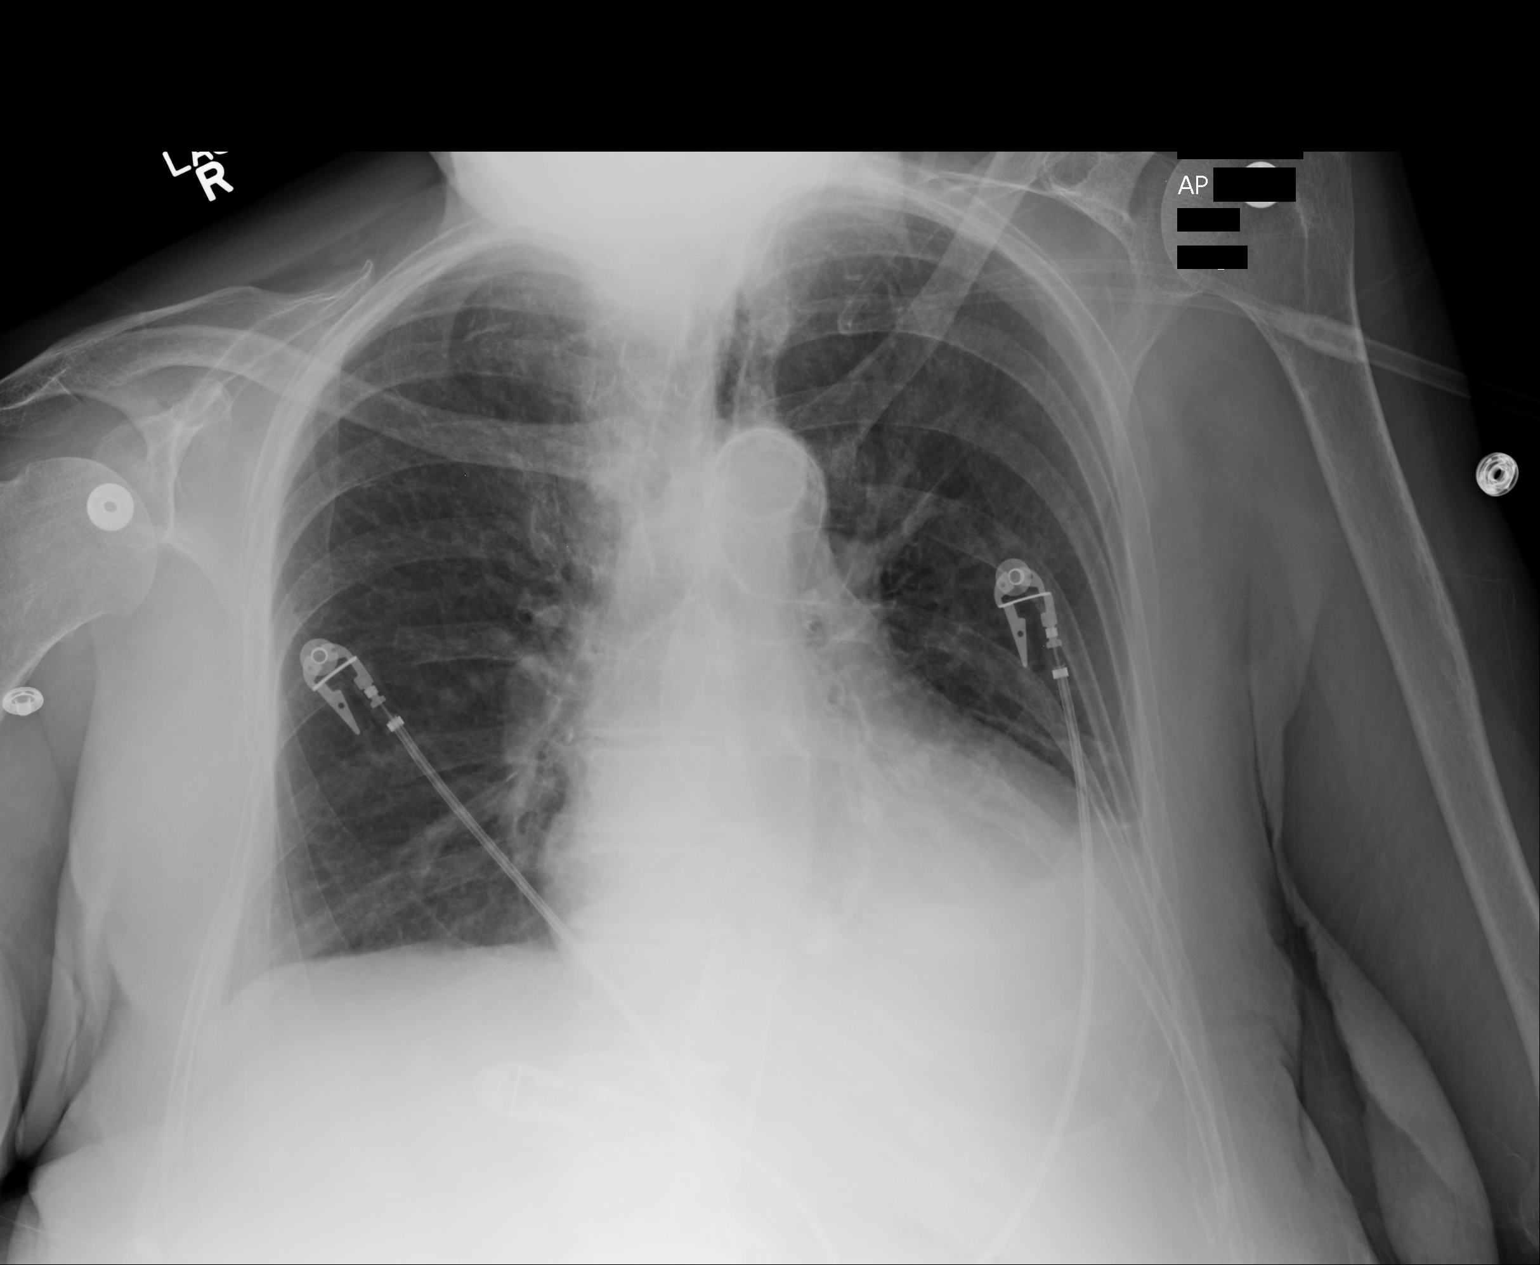

[1 of 1 positions shown; findings below may reference images not displayed]

FINDINGS: Interval development of left basilar opacity obscuring the left
hemidiaphragm. There may be a small associated pleural effusion.
Negative for pulmonary edema. No pneumothorax. Atherosclerotic and
tortuous thoracic aorta again noted. Chronic central bronchitic
changes are similar compared to prior. The bones appear slightly
osteopenic.
IMPRESSION: 1. Interval development of a left basilar opacity concerning for
pneumonia in the appropriate clinical setting.
2. Probable associated left pleural effusion.
